# Patient Record
Sex: Female | Born: 1945
Health system: Southern US, Community
[De-identification: ages and names within clinical notes are randomized; demographics above are authoritative.]

## PROBLEM LIST (undated history)

## (undated) DIAGNOSIS — I10 Essential (primary) hypertension: Secondary | ICD-10-CM

## (undated) DIAGNOSIS — E119 Type 2 diabetes mellitus without complications: Secondary | ICD-10-CM

## (undated) DIAGNOSIS — E785 Hyperlipidemia, unspecified: Secondary | ICD-10-CM

## (undated) DIAGNOSIS — M79661 Pain in right lower leg: Secondary | ICD-10-CM

## (undated) DIAGNOSIS — I499 Cardiac arrhythmia, unspecified: Secondary | ICD-10-CM

## (undated) DIAGNOSIS — G629 Polyneuropathy, unspecified: Secondary | ICD-10-CM

## (undated) DIAGNOSIS — E559 Vitamin D deficiency, unspecified: Secondary | ICD-10-CM

## (undated) DIAGNOSIS — K219 Gastro-esophageal reflux disease without esophagitis: Secondary | ICD-10-CM

## (undated) DIAGNOSIS — M79662 Pain in left lower leg: Secondary | ICD-10-CM

## (undated) HISTORY — DX: Cardiac arrhythmia, unspecified: I49.9

## (undated) HISTORY — PX: OTHER SURGICAL HISTORY: SHX169

## (undated) HISTORY — DX: Polyneuropathy, unspecified: G62.9

## (undated) HISTORY — DX: Gastro-esophageal reflux disease without esophagitis: K21.9

## (undated) HISTORY — PX: COLONOSCOPY: SHX174

## (undated) HISTORY — PX: ABDOMINAL HYSTERECTOMY: SHX81

## (undated) HISTORY — DX: Pain in right lower leg: M79.661

## (undated) HISTORY — DX: Vitamin D deficiency, unspecified: E55.9

## (undated) HISTORY — DX: Hyperlipidemia, unspecified: E78.5

## (undated) HISTORY — DX: Pain in left lower leg: M79.662

---

## 2006-07-26 ENCOUNTER — Ambulatory Visit (HOSPITAL_COMMUNITY): Admission: RE | Admit: 2006-07-26 | Discharge: 2006-07-26 | Payer: Self-pay | Admitting: *Deleted

## 2006-08-05 ENCOUNTER — Emergency Department (HOSPITAL_COMMUNITY): Admission: EM | Admit: 2006-08-05 | Discharge: 2006-08-05 | Payer: Self-pay | Admitting: Emergency Medicine

## 2006-08-10 ENCOUNTER — Encounter (HOSPITAL_BASED_OUTPATIENT_CLINIC_OR_DEPARTMENT_OTHER): Admission: RE | Admit: 2006-08-10 | Discharge: 2006-11-08 | Payer: Self-pay | Admitting: Surgery

## 2006-11-25 ENCOUNTER — Encounter (HOSPITAL_BASED_OUTPATIENT_CLINIC_OR_DEPARTMENT_OTHER): Admission: RE | Admit: 2006-11-25 | Discharge: 2006-12-01 | Payer: Self-pay | Admitting: Surgery

## 2009-05-18 ENCOUNTER — Emergency Department (HOSPITAL_COMMUNITY): Admission: EM | Admit: 2009-05-18 | Discharge: 2009-05-18 | Payer: Self-pay | Admitting: Emergency Medicine

## 2010-09-30 NOTE — Assessment & Plan Note (Signed)
[facility]   NAME:  Sabrina Suarez, Sabrina Suarez                ACCOUNT NO.:  [id number]   MEDICAL RECORD NO.:  [id number]      DATE OF BIRTH:  01-16-1946   PHYSICIAN:  Theresia Majors. Tanda Rockers, M.D.      VISIT DATE:                                   OFFICE VISIT   SUBJECTIVE:  Sabrina Suarez is a 65 year old lady who we have followed for  leuko-clastic small vessel vasculitis.  She has been concurrently seen  by Dr. Phylliss Bob from rheumatology and Dr. Mirna Mires, her primary care  physician.  She has recently been tapered from steroids.  She returns  for follow-up.  There has been no interim drainage.  There are minor  areas of skin breakage.  There has been no fever.   OBJECTIVE:  VITAL SIGNS:  Blood pressure is 134/82, respirations 20,  pulse rate 86, temperature 98.1.  Inspection of the lower extremity shows that all of the wounds have  completely re-epithelialized.  There is no drainage.  There is  persistent 1 to 2+ edema in the lower extremities with hyperpigmentation  in the previous ulcerations.  There is no evidence of active infection,  pain or tenderness.   ASSESSMENT:  Resolve lesions of leuko-clastic small vessel vasculitis.   PLAN:  We are discharging the patient from active followup in the wound  center.  We have encouraged her to keep her follow-up appointments per  Dr. Phylliss Bob and Dr. Loleta Chance.  We have explained these recommendations to the  patient and her sister, in terms that they seem to understand.  They  express gratitude for having been seen in the clinic and indicate that  they will be compliant.  The patient is discharged.      Harold A. Tanda Rockers, M.D.  Electronically Signed     HAN/MEDQ  D:  11/29/2006  T:  11/30/2006  Job:  [id number]   cc:   Areatha Keas, M.D.  Annia Friendly. Loleta Chance, MD

## 2010-10-03 NOTE — Assessment & Plan Note (Signed)
[facility]   NAME:  Sabrina Suarez, Sabrina Suarez                ACCOUNT NO.:  [id number]   MEDICAL RECORD NO.:  [id number]           DATE OF BIRTH:   PHYSICIAN:  Theresia Majors. Tanda Rockers, M.D. VISIT DATE:  09/01/2006                                   OFFICE VISIT   VITAL SIGNS:  Blood pressure is 166/93, respirations 20, pulse rate 97,  temperature is 98.1.   PURPOSE OF TODAY'S VISIT:  [name] is a 65 year old lady who we are  seeing in followup for ulcers in blister formation related to  leukocytoclastic small vessel vasculitis.  The patient is concurrently  being seen by Dr. Phylliss Bob and Dr. Loleta Chance.  Since her last visit, she reports  that she has had less pain.  She is increasing her activity.  She has  complained of some nausea and a sensation of a medical taste in her  mouth.  She denies fever.  She denies loose stools or abdominal  cramping.   WOUND EXAM:  Inspection of the lower extremity shows that there is good  control of the previous edema with the compression wrap.  There are  lineal wrinkles in the legs.  There is no evidence of ischemia.  The  ulcers, themselves, all show evidence of improvement.  There are  multiple areas of thickened, well-adherent eschar.  There are several  areas of loosened, liquefying necrosis.  Three areas of wounds underwent  a full thickness debridement, utilizing a 15 blade.  There is no  evidence of infection or ascending cellulitis or lymphangitis.   DIAGNOSIS:  Clinical improvement of ulcerations related to  leukocytoclastic small vessel vasculitis.   MANAGEMENT PLAN & GOAL:  Patient will continue and keep all of her  appointment with her physicians.  In addition, we will see her in the  wound clinic in 1 week.  We have encouraged her to take her p.o.  analgesia prior to coming to the wound center as she will need serial  debridements to create a healthy wound bed.  We have given the patient  an opportunity to asks questions.  She seems to  understand and indicates  that she will be compliant as per above.   There is an interin note from Dr. 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  We  have requested copies of the path report.      Harold A. Tanda Rockers, M.D.  Electronically Signed     HAN/MEDQ  D:  09/01/2006  T:  09/01/2006  Job:  [id number]

## 2010-10-03 NOTE — Consult Note (Signed)
NAMEVELMER, BROADFOOT                ACCOUNT NO.:  [id number]   MEDICAL RECORD NO.:  [id number]          PATIENT TYPE:  REC   LOCATION:  FOOT                         FACILITY:  [facility]   PHYSICIAN:  Theresia Majors. Tanda Rockers, M.D.DATE OF BIRTH:  May 11, 1946   DATE OF CONSULTATION:  08/11/2006  DATE OF DISCHARGE:                                 CONSULTATION   REASON FOR CONSULTATION:  Ms. Tarnow is a 65 year old female referred by  Dr. Mirna Mires for evaluation of bilateral painful ulcerations in the  lower extremity.   IMPRESSION:  Small vessel vasculitis, questionable etiology, of the  lower extremities.   RECOMMENDATIONS:  The patient will continue on the anti-inflammatory  regimen as per Dr. Phylliss Bob.  We will see the patient biweekly for dressing  changes, serial debridements as needed and for wraps to control  secondary edema.  Our objectives will be to prevent and treat secondary  infection while she undergoes concurrent management per her  Dermatologist and Rhematologist.  Tapering of the anti-inflammatory  treatments are in progress.   SUBJECTIVE:  This 65 year old lady was in good health compensated and  stable, until 5-6 weeks ago when she noted early blister formation on  the feet.  Over the ensuing weeks the blisters became intensely painful  associated with fever, erosion, drainage and malodor.  She was seen by  her primary care physician and then secondly seen by Dr. Nita Sells in  [place] and she underwent biopsies which confirmed the presence of  vasculitis.  She was started on hydrocortisone with some initial relief  but her symptoms continued to worsen.  She was most recently seen by Dr.  Phylliss Bob who increased her steroids to 80 mg per day and added colchicine.  Thereafter, she experienced less pain.  She was seen in followup by Dr.  Loleta Chance and was referred to the wound center for dressing changes.   PAST MEDICAL HISTORY:   ALLERGIES:  HER PAST MEDICAL HISTORY IS REMARKABLE FOR  ALLERGIES TO  SUDAFED AND BENADRYL.   CURRENT MEDICATIONS:  Her current medications include Keflex 500 mg p.o.  t.i.d., OxyContin 20 mg p.r.n. for pain related to the current illness,  colchicine 0.6 mg b.i.d., Darvocet-N p.r.n., and prednisone 80 mg daily.   PAST SURGICAL HISTORY:  Past surgery has included an abdominal  hysterectomy for metromenorrhagia  approximately 20 years ago.  She  denies further surgery.   FAMILY HISTORY:  Positive for hypertension, diabetes, stroke, cancer,  and heart attack.   SOCIAL HISTORY:  She is a widow.  She has two children who live  remotely.  She is currently disabled due to degenerative lumbar spine  disease.  She is part-time employed at the [employer].   REVIEW OF SYSTEMS:  The review of systems is remarkable for the absence  of angina pectoris, her weight has been stable, there is no bowel or  bladder dysfunction, she denies visual changes and she has had some  recent hemoptysis on 2 days that she feels is related to a cut area on  her tongue.  She denies paralysis, speech impairment or  other stigmata  of TIAs.  The remainder of the review of systems is negative.   PHYSICAL EXAMINATION:  GENERAL:  She is alert, oriented, in no acute  pain.  She is accompanied by her sister.  VITAL SIGNS:  Her blood pressure is 138/76, respirations are 20, pulse  rate 74, temperature 98.2.  HEENT:  Exam is clear.  NECK:  Is supple.  Trachea is midline.  Thyroid is nonpalpable.  LUNGS:  Clear.  HEART:  The heart sounds are normal.  ABDOMEN:  Is soft.  EXTREMITIES:  The lower extremity exam is remarkable for 3+ bilateral  edema.  There are multiple macular full thickness necrotic areas with  dark eschar and minimum exudate on both lower extremities.  These wounds  were photographed from the right lateral perspective, the right medial  perspective, the left leg anterior and the left leg posteriorly.  The  wounds were categorized from these projections  and entered into the  computer program.  No debridements were performed.  The wounds all  appear to have the central area of necrosis with a halo of erythema  which is nontender.  The patient reports that 48 hours ago these wounds  were exquisitely tender.  There is no local warmth and there is no  malodor.  The dorsalis pedis pulse is 3+ bilaterally in spite of the  edema.  NEUROLOGICAL:  Her protective sensation is preserved.   DISCUSSION:  This patient presents with a history suggestive of some  form of autoimmune pathology involving small vessel vasculitis.  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  We have explained to the patient and her sister that the wound center  can be of assistance in dressing management in support of the efforts  that are currently being undertaken and prescribed by Dr. Phylliss Bob and Dr.  Margo Aye.  We have encouraged her to keep her appointments and continue all  of her medications.  We will change her dressings as need be which I  suspect will require initially twice weekly dressing changes.  We will  use a silver dressing to control MRSA colonization which she has had a  positive culture of.  We have given the patient and her sister an  opportunity to ask questions.  They seem to appreciate the approach that  has been outlined and indicate that they will be compliant.  The patient  has signed a release for Dr. Margo Aye to forward the medical records to the  wound center.  We will proceed with a silver dressing and a multilayer  compression wrap and reevaluate the patient in 3 days for possible  redressing change and debridement after she is seen by Dr. Phylliss Bob.      Harold A. Tanda Rockers, M.D.  Electronically Signed     HAN/MEDQ  D:  08/11/2006  T:  08/11/2006  Job:  [id number]  cc:   Annia Friendly. Loleta Chance, MD  Areatha Keas, M.D.

## 2010-10-03 NOTE — Assessment & Plan Note (Signed)
[facility]   NAME:  Sabrina Suarez, Sabrina Suarez                ACCOUNT NO.:  [id number]   MEDICAL RECORD NO.:  [id number]      DATE OF BIRTH:  Jan 31, 1946   PHYSICIAN:  Theresia Majors. Tanda Rockers, M.D. VISIT DATE:  09/13/2006                                   OFFICE VISIT   SUBJECTIVE:  Miss [name] is a 65 year old lady who we are seeing in  follow up for leukoclastic small vessel vasculitis with ulceration.  She  is concurrently taking 80 mg of prednisone under the direction of Dr.  Phylliss Bob, and she continues to be followed by her family practitioner, Dr.  Loleta Chance.  In the interim, she denies excessive drainage, malodor, pain, or  fever.   OBJECTIVE:  Blood pressure is 151/90, respirations 16, pulse rate is 94,  temperature is 98.3.  Inspection of the lower extremity shows that there  is bilateral 2+ edema.  Wounds #1 and 2 are free of eschar and show  granulating buds.  There is no drainage.  There is no evidence of  infection.  Wounds 3, 4, and 5 have autolytic eschars which required  full thickness debridement with hemorrhage control with direct pressure.  Pain was tolerated very well.  There is no excessive drainage or  malodorous exudate.  There is no evidence of infection.  The pedal  pulses remain readily palpable.   IMPRESSION:  Clinical improvement of LCSVV with fluid retention.   PLAN:  We will continue the serial debridements.  We will add Profore  compression wraps to help control the edema.  We will add triamcinolone  ointment for skin conditioning and to avoid blister formation attendant  with fluid retention.  The patient will be seen by Dr. Phylliss Bob tomorrow.  If need be, these dressings can be taken off and the patient can return  to the clinic for re-application of the compression wraps.  We will  otherwise see the patient in one week.  We have counseled the patient  regarding the expectations that there may be malodor and some drainage  through her wrap.  If these occur,  she can call for an interim  appointment, otherwise, this is anticipated due to the autolysis.  The  patient and her sister seem to understand and indicate that they will be  compliant.  We have given them an opportunity to ask questions.  They  express gratitude for having been seen in the clinic and indicate that  they will be compliant.      Harold A. Tanda Rockers, M.D.  Electronically Signed     HAN/MEDQ  D:  09/13/2006  T:  09/13/2006  Job:  [id number]   cc:   Areatha Keas, M.D.  Dr. Loleta Chance

## 2010-10-03 NOTE — Consult Note (Signed)
Sabrina Suarez, Sabrina Suarez                ACCOUNT NO.:  [id number]   MEDICAL RECORD NO.:  [id number]          PATIENT TYPE:  REC   LOCATION:  FOOT                         FACILITY:  [facility]   PHYSICIAN:  Jonelle Sports. Sevier, M.D. DATE OF BIRTH:  1945/06/23   DATE OF CONSULTATION:  08/18/2006  DATE OF DISCHARGE:                                 CONSULTATION   WOUND CENTER PROGRESS NOTE:   HISTORY:  This 65 year old black female is seen with multiple necrotic  ulcerative lesions of the lower extremities which on biopsy have proved  to be a type of vasculitis.  She has been placed on prednisone by Dr.  Phylliss Bob and has made some improvement since that time and most happily  reports that there have been no new lesions developed since her last  visit here a week ago.  Our job here has been primarily to deal with the  wound care itself and particularly dressing changes as needed.   She reports essentially continuous burning pain rather severe in both  lower extremities at all times she would rank on a level of 7-8 out of  10.  She is on Vicodin for this which is being supplied elsewhere.  She  admits that gives her some reasonable control.   She has been otherwise well and has had no other change in her  medications.   EXAMINATION:  Today blood pressure 134/80, pulse 71 regular,  respirations 20, temperature 98.3.   There are on both legs numerous ulcerated areas with necrotic central  eschars, if not necrotic total eschars, on most of these.  The others  are punched out and clearly are full thickness ulcerations.  The major  ones of these are cataloged with measurements in the patient's wound  center chart being cataloged primarily in groupings as the right lateral  leg, the right medial leg, the left anterior leg, the left posterior leg  and the left anterior ankle.   Debridement is attempted today and I am actually able fully to remove  the eschars on two of the right lateral lesions and the one  left lateral  lesion.  In some of the others I am able partially to remove eschars but  they are simply too deep that attempting to dislodge them is quite  painful to the patient.   What I have done is to crosshatch all of the remaining eschars and  following this all wounds are treated with Vaseline to the periwound  area for its protection and then an application of Accuzyme to the  wounds themselves both those still carrying eschars and to the others  which are not carrying eschars but which are nonetheless inadequately  debrided.  The areas are then covered with SofSorb pads and both lower  extremities are placed in Profore wraps.   IMPRESSION:  My impression is that there has been improvement primarily  in that there have been no new lesions to develop and maybe some very,  very limited improvement in the ones that already exist.   It is anticipated that we will continue wound care and that  as the  inflammatory process hopefully subsides under the influence of the  steroid therapy we can get these eschars totally cleared and then  proceed with Oasis or possibly even Apligraf in some of the larger  lesions if such seems indicated.  It is certainly anticipated this will  be a long-term process and the patient is advised accordingly.   She is to continue her visits to her medical doctor, Dr. Loleta Chance and also  to the dermatologist, Dr. Nita Sells as well as to return here in 1 week.           ______________________________  Jonelle Sports. Cheryll Cockayne, M.D.     RES/MEDQ  D:  08/18/2006  T:  08/18/2006  Job:  [id number]   cc:   Mertha Finders., M.D.  Fax: [phone]   Areatha Keas, M.D.  Fax: [phone]

## 2010-10-03 NOTE — Assessment & Plan Note (Signed)
[facility]   NAME:  Sabrina Suarez, Sabrina Suarez                ACCOUNT NO.:  [id number]   MEDICAL RECORD NO.:  [id number]      DATE OF BIRTH:  [birth year]   PHYSICIAN:  Theresia Majors. Tanda Rockers, M.D. VISIT DATE:  10/18/2006                                   OFFICE VISIT   SUBJECTIVE:  Sabrina Suarez is a 65 year old lady who we are following for  leukocytoclastic small vessel vasculitis with bilateral ulcerations.  The patient has concurrently been on 40 mg of prednisone.  She is being  tapered down to 20 mg to begin tomorrow.  In the interim, she has had no  excessive drainage, malodor, or pain.  She has been using an antiseptic  soap washes twice a day and reports that there has been significant  improvement in the appearance of the wounds.  She continues to have  moderate swelling.  There has been no fever.   OBJECTIVE:  Blood pressure is 142/88, respirations 16, pulse rate 92,  temperature 98.4.  She is accompanied by her sister.  Wounds 1, 2, 3, 4,  and 5 have all been measured and photographed.  There are areas of  improvement demonstrated at all levels with advancing epithelium.  Areas  of crusted exudate were left undisturbed, as they are significantly  decreased and are improving with the mechanical debridements attendant  with showers and baths.  The pedal pulses remain palpable.  There is no  evidence of vascular compromise.   ASSESSMENT:  Clinical improvement.   PLAN:  We will continue local hygiene using the antiseptic soap, b.i.d.  showers, and tub baths.  We will reevaluate the patient in 1 week.  Judging from the presence of today's edema, which is 2+, the patient may  require compression wraps to continue contraction of the wounds.  We  have explained this to Sabrina Suarez in terms that she seems to understand.  We have given her an opportunity to ask questions with regard to  treatment plan.  She indicates that she understands and will be  compliant with the  aforementioned.  We will reevaluate the patient in 1  week.      Harold A. Tanda Rockers, M.D.  Electronically Signed     HAN/MEDQ  D:  10/18/2006  T:  10/18/2006  Job:  [id number]

## 2010-10-03 NOTE — Assessment & Plan Note (Signed)
[facility]   NAME:  AMORY, ZBIKOWSKI                ACCOUNT NO.:  [id number]   MEDICAL RECORD NO.:  [id number]           DATE OF BIRTH:   PHYSICIAN:  Maxwell Caul, M.D.      VISIT DATE:                                   OFFICE VISIT   SUBJECTIVE:  Ms. Woodside is a lady with a multitude of lower extremity  ulcers secondary to leukocytoclastic vasculitis.  She has been on  prednisone, currently 60 mg per day.  Over the last week, she has simply  been washing these b.i.d. with antiseptic soap and apply dry dressings.  She returns today in followup.   OBJECTIVE:  Temperature is 98.3, pulse 76, respiration is 18, blood  pressure 140/90.  Most of the worrisome wounds actually are on her right  leg on the lateral aspect.  I went ahead and debrided 2 of these wounds  using EMLA on 1 and LET on the other for anesthesia, hemostasis with  direct pressure, using a #15 blade.  There were 2 other wounds on the  medial aspect of the right leg that probably needed debridement, they  have thick black eschar on them; however, due to pain issues, I did not  proceed with this.  Her left leg actually looks a lot better than the  right with no edema and wounds that look like the eschar is  progressively coming off on its own.   PLAN:  We applied Promogran to the open wounds in the right leg and  wrapped this in a Profore.  There was no evidence of infection in any of  these wounds.  I think she will need further debridements at a later  date; however, most of the open wounds now appear clean, and we will  have to see if control of the edema with the prednisone will allow some  healing to occur in this difficult clinical state.           ______________________________  Maxwell Caul, M.D.     MGR/MEDQ  D:  09/27/2006  T:  09/27/2006  Job:  [id number]

## 2010-10-03 NOTE — Assessment & Plan Note (Signed)
[facility]   NAME:  Sabrina Suarez, Sabrina Suarez                ACCOUNT NO.:  [id number]   MEDICAL RECORD NO.:  [id number]      DATE OF BIRTH:  [birth year]   PHYSICIAN:  Theresia Majors. Tanda Rockers, M.D. VISIT DATE:  11/01/2006                                   OFFICE VISIT   SUBJECTIVE:  Sabrina Suarez is a 65 year old lady who is being treated  collectively by Dr. Phylliss Bob in the wound center for the management of  leukocytoclastic small vessel vasculitis and ulcerations.  Since her  last visit, she has continued to decrease her prednisone.  She is now on  30 mg a day, and she has been started on methotrexate.  The wounds have  shown continued improvement.  There still remains some drainage, but  there has been no fever.  She completed a 10-day course of Septra and  doxycycline.  She has had no fever and no pain.  She continues to be  ambulatory.   OBJECTIVE:  Blood pressure is 153/104, respirations 20, pulse rate 76,  temperature is 98.3.  Inspection of the lower extremity shows that the  ulcers have uniformly improved.  The wounds have been previously  cataloged and numbered 1 through 5.  Wound #3 is completely resolved.  Wounds 1, 2, 4 and 5 all show decrease in volume.  There are areas of  desquamation, but there is no evidence of extensive cellulitis or  abscess formation.  We have recultured the wounds today. Most recent  culture was positive for methicillin resistant staph.  Pedal pulses  remain palpable.   ASSESSMENT:  Clinical improvement.   PLAN:  We will continue daily antiseptic soap washes.  We will continue  doxycycline, and add 100 mg p.o. b.i.d. and Septra-DS 1 p.o. b.i.d.  We  will reevaluate the patient in one month p.r.n.      Harold A. Tanda Rockers, M.D.  Electronically Signed     HAN/MEDQ  D:  11/01/2006  T:  11/01/2006  Job:  [id number]   cc:   Areatha Keas, M.D.

## 2010-10-03 NOTE — Assessment & Plan Note (Signed)
[facility]   NAME:  Sabrina Suarez                ACCOUNT NO.:  [id number]   MEDICAL RECORD NO.:  [id number]      DATE OF BIRTH:  12/13/1945   PHYSICIAN:  Theresia Majors. Tanda Rockers, M.D. VISIT DATE:  08/25/2006                                   OFFICE VISIT   SUBJECTIVE:  Ms. Sabrina Suarez is a 65 year old lady who we are following for a  leukoclastic small vessel vasculitis with ulceration in her lower  extremities.  She has been seen in concert with Dermatology and  Rheumatology.  She is currently on a stable dose of steroids.  She is  taking 80 mg of prednisone a day.  She reports that she has polyphagia,  but her pain is significantly less.  There has been no malodorous  drainage and no fever.   OBJECTIVE:  Blood pressure is 140/70, respirations 20, pulse rate 89,  temperature 98.6.   Inspection of the lower extremities shows that the areas of full  thickness necrosis are persistent.  There is associated 2+ edema.  There  is no evidence of ascending infection or cellulitis.  The wounds on the  lateral side of the right leg, the medial side of the right leg, and the  anteromedial left leg were inspected, photographed, and catalogued.  The  wounds were debrided using topical EMLA cream.  There was a moderate  amount of pain, but the patient tolerated the procedures very well.  We  put a test dose of Accuzyme on several wounds and the patient reported  no increased pain.   ASSESSMENT:  Leukoclastic small vessel vasculitis with ulceration,  improved.   PLAN:  We have placed the patient in bilateral Profore wraps with  topical Accuzyme enzymatic debridement.  We will reevaluate her in one  week.      Harold A. Tanda Rockers, M.D.  Electronically Signed     HAN/MEDQ  D:  08/25/2006  T:  08/25/2006  Job:  [id number]   cc:   Areatha Keas, M.D.  Dr. Margo Aye  Dr. Loleta Chance

## 2010-10-03 NOTE — Assessment & Plan Note (Signed)
[facility]   NAME:  Sabrina Suarez, Sabrina Suarez                ACCOUNT NO.:  [id number]   MEDICAL RECORD NO.:  [id number]      DATE OF BIRTH:  Jan 10, 1946   PHYSICIAN:  Theresia Majors. Tanda Rockers, M.D. VISIT DATE:  09/20/2006                                   OFFICE VISIT   SUBJECTIVE:  Sabrina Suarez is a 65 year old lady who we are following for a  leukoclastic small vessel vasculitis.  In the interim, she has been seen  by Dr. Phylliss Bob who has decreased her prednisone from 80 mg to 60 mg.  She  was placed in Profore wraps during her last visit.  She continued  without excessive drainage, malodor, pain, or fever.  She does report  some discomfort on the lateral aspect of the right lower extremity.   OBJECTIVE:  Blood pressure is 147/68, respirations are 20, pulse rate  89, temperature is 98.4.  Inspection of the lower extremities shows that  all of the wounds are decreasing in volume.  They are no longer  edematous.  The eschars are liquefying with areas of liquefaction  necrosis and a yellowish-brown exudate.  Wounds #3, 4, and 5 on the left  lower extremity were debrided with a #10 blade with hemorrhage  controlled with direct pressure.  The patient tolerated this debridement  very well with minimum pain.  There is no evidence of vascular  compromise.  There is no evidence of ascending infection.   ASSESSMENT:  Clinical improvement.   PLAN:  We will not place the patient in Profore wraps, but rather apply  fish-nets and 4 x 4's.  Allow her to take antiseptic soap baths twice a  day, and re-dress the wounds with the dry dressings.  We will reevaluate  her in 1 week.  We have counseled the patient that if she develops  progressive swelling, she should give [place] a call and plan to come back to  the clinic to have the compression wraps replaced.  At this point, her  edema is reasonably controlled with elevation.  The discomfort that she  is experiencing in the lateral left lower extremity is  not explained by  the physical exam.  There appears to be no evidence of infection.  We  have suggested that she increase her OxyContin to every 4 to 6 hours as  needed to control the discomfort.  If she is still experiencing pain,  she should give the wound center or Dr. Phylliss Bob an interim call.  Otherwise, we will reevaluate her in 1 week.  We have given the patient  and her sister an opportunity to ask questions.  They seem to understand  and indicate that they will be compliant.      Harold A. Tanda Rockers, M.D.  Electronically Signed     HAN/MEDQ  D:  09/20/2006  T:  09/20/2006  Job:  [id number]   cc:   Areatha Keas, M.D.  Annia Friendly. Loleta Chance, MD

## 2010-10-03 NOTE — Assessment & Plan Note (Signed)
[facility]   NAME:  Sabrina Suarez, Sabrina Suarez                ACCOUNT NO.:  [id number]   MEDICAL RECORD NO.:  [id number]      DATE OF BIRTH:  [birth year]   PHYSICIAN:  Theresia Majors. Tanda Rockers, M.D. VISIT DATE:  10/04/2006                                   OFFICE VISIT   SUBJECTIVE:  Sabrina Suarez is a 65 year old lady who we are treating for  the ulcerations related to leukoclastic small vessel vasculitis.  In the  interim, we have treated her with mild compression and antiseptic soap  bathing and serial debridements.  She is currently being seen by Dr.  Phylliss Bob and has started a tapering of her steroid therapy.  She denies  recurrent ulcerations, excessive pain, or malodor.   OBJECTIVE:  Blood pressure is 149/94, respirations 20, pulse rate 91,  temperature 98.5.  Inspection of the lower extremity shows that the  wounds are in varying stages of healing, which range from 100%  granulation to heavy mucopurulent scabs.  Multiple areas of scabs were  debrided using a sharp technique.  We debrided 3 separate areas.  Hemorrhage was controlled with direct pressure.  The wounds were not  excessively tender.  The patient tolerated this procedure very well.  There is 1+ to trace edema.  No evidence of active infection and no  evidence of ischemia.   ASSESSMENT:  Clinically responding ulcers of leukoclastic small vessel  vasculitis.   PLAN:  We have changed the patient's wound care technique now to include  twice daily baths utilizing antiseptic soap and dry dressings.  We will  re-evaluate the patient in 2 weeks.  We have counseled the patient and  her sister that if there is any evidence of recurrent wounds, excessive  pain, malodor, they are to call the office for an interim appointment.  They both seem to understand and indicate that they will be compliant.      Harold A. Tanda Rockers, M.D.  Electronically Signed     HAN/MEDQ  D:  10/04/2006  T:  10/04/2006  Job:  [id number]

## 2010-10-03 NOTE — Assessment & Plan Note (Signed)
[facility]   NAME:  Sabrina Suarez, Sabrina Suarez                ACCOUNT NO.:  [id number]   MEDICAL RECORD NO.:  [id number]      DATE OF BIRTH:  1945/09/25   PHYSICIAN:  Theresia Majors. Tanda Rockers, M.D.      VISIT DATE:                                   OFFICE VISIT   SUBJECTIVE:  Sabrina Suarez is a 65 year old lady who we are following for  leukoclastic small vessel vasculitis with ulcerations and bulla  formation.  In the interim, we have treated her with topical Accuzyme  and Profore Lite.  She continues on 80 mg of prednisone daily.  She has  had no interim fever or excessive pain.  She returns for follow up.   OBJECTIVE:  Blood pressure is 155/97.  Respirations 18.  Pulse rate 90.  Temperature 98.4.  inspection of the lower extremity shows that the  right lateral leg has areas of full thickness necrosis breakdown and  liquefaction on the _______ block.  These areas were debrided with a 10  blade with hemorrhage control with direct pressure.  Similarly there was  a debridement of the right medial leg.  Using a full thickness and 10  blade with hemorrhage control with direct pressure.  The ulcers of the  anterior, posterior and lateral left lower extremity have well adherent  scars without liquefaction.  There is no evidence of active infection or  vascular compromise.   ASSESSMENT:  Clinical improvement.   PLAN:  We will place 4 x 4 dressings over these open wounds and apply a  fishnet to keep the dressings in place.  We have given the patient  liberty to take a tub bath with antiseptic soap and to reapply the  dressings as demonstrated in the wound center.  We will reevaluate her  in 4 days.  Patient is to continue all of her follow ups with her  primary care physicians.      Harold A. Tanda Rockers, M.D.  Electronically Signed     HAN/MEDQ  D:  09/08/2006  T:  09/08/2006  Job:  [id number]

## 2014-07-30 DIAGNOSIS — I10 Essential (primary) hypertension: Secondary | ICD-10-CM | POA: Diagnosis not present

## 2014-07-30 DIAGNOSIS — R739 Hyperglycemia, unspecified: Secondary | ICD-10-CM | POA: Diagnosis not present

## 2014-07-30 DIAGNOSIS — E1165 Type 2 diabetes mellitus with hyperglycemia: Secondary | ICD-10-CM | POA: Diagnosis not present

## 2014-09-26 DIAGNOSIS — E119 Type 2 diabetes mellitus without complications: Secondary | ICD-10-CM | POA: Diagnosis not present

## 2014-10-22 DIAGNOSIS — R928 Other abnormal and inconclusive findings on diagnostic imaging of breast: Secondary | ICD-10-CM | POA: Diagnosis not present

## 2014-10-22 DIAGNOSIS — Z1231 Encounter for screening mammogram for malignant neoplasm of breast: Secondary | ICD-10-CM | POA: Diagnosis not present

## 2014-11-07 DIAGNOSIS — N6489 Other specified disorders of breast: Secondary | ICD-10-CM | POA: Diagnosis not present

## 2014-11-07 DIAGNOSIS — R928 Other abnormal and inconclusive findings on diagnostic imaging of breast: Secondary | ICD-10-CM | POA: Diagnosis not present

## 2014-11-16 DIAGNOSIS — E780 Pure hypercholesterolemia: Secondary | ICD-10-CM | POA: Diagnosis not present

## 2014-11-16 DIAGNOSIS — I10 Essential (primary) hypertension: Secondary | ICD-10-CM | POA: Diagnosis not present

## 2014-11-16 DIAGNOSIS — E784 Other hyperlipidemia: Secondary | ICD-10-CM | POA: Diagnosis not present

## 2014-11-16 DIAGNOSIS — E1165 Type 2 diabetes mellitus with hyperglycemia: Secondary | ICD-10-CM | POA: Diagnosis not present

## 2014-11-16 DIAGNOSIS — E785 Hyperlipidemia, unspecified: Secondary | ICD-10-CM | POA: Diagnosis not present

## 2014-11-16 DIAGNOSIS — E119 Type 2 diabetes mellitus without complications: Secondary | ICD-10-CM | POA: Diagnosis not present

## 2014-11-16 DIAGNOSIS — K219 Gastro-esophageal reflux disease without esophagitis: Secondary | ICD-10-CM | POA: Diagnosis not present

## 2014-11-27 DIAGNOSIS — E1121 Type 2 diabetes mellitus with diabetic nephropathy: Secondary | ICD-10-CM | POA: Diagnosis not present

## 2014-11-27 DIAGNOSIS — E1165 Type 2 diabetes mellitus with hyperglycemia: Secondary | ICD-10-CM | POA: Diagnosis not present

## 2014-11-27 DIAGNOSIS — I10 Essential (primary) hypertension: Secondary | ICD-10-CM | POA: Diagnosis not present

## 2014-11-27 DIAGNOSIS — K219 Gastro-esophageal reflux disease without esophagitis: Secondary | ICD-10-CM | POA: Diagnosis not present

## 2015-02-26 DIAGNOSIS — E1121 Type 2 diabetes mellitus with diabetic nephropathy: Secondary | ICD-10-CM | POA: Diagnosis not present

## 2015-02-26 DIAGNOSIS — K219 Gastro-esophageal reflux disease without esophagitis: Secondary | ICD-10-CM | POA: Diagnosis not present

## 2015-02-26 DIAGNOSIS — Z23 Encounter for immunization: Secondary | ICD-10-CM | POA: Diagnosis not present

## 2015-02-26 DIAGNOSIS — E784 Other hyperlipidemia: Secondary | ICD-10-CM | POA: Diagnosis not present

## 2015-02-26 DIAGNOSIS — I10 Essential (primary) hypertension: Secondary | ICD-10-CM | POA: Diagnosis not present

## 2015-06-04 ENCOUNTER — Other Ambulatory Visit (HOSPITAL_COMMUNITY): Payer: Self-pay | Admitting: Internal Medicine

## 2015-06-04 DIAGNOSIS — Z23 Encounter for immunization: Secondary | ICD-10-CM | POA: Diagnosis not present

## 2015-06-04 DIAGNOSIS — E119 Type 2 diabetes mellitus without complications: Secondary | ICD-10-CM | POA: Diagnosis not present

## 2015-06-04 DIAGNOSIS — E785 Hyperlipidemia, unspecified: Secondary | ICD-10-CM | POA: Diagnosis not present

## 2015-06-04 DIAGNOSIS — I10 Essential (primary) hypertension: Secondary | ICD-10-CM | POA: Diagnosis not present

## 2015-06-04 DIAGNOSIS — E1165 Type 2 diabetes mellitus with hyperglycemia: Secondary | ICD-10-CM | POA: Diagnosis not present

## 2015-06-04 DIAGNOSIS — M1990 Unspecified osteoarthritis, unspecified site: Secondary | ICD-10-CM

## 2015-06-04 DIAGNOSIS — K219 Gastro-esophageal reflux disease without esophagitis: Secondary | ICD-10-CM | POA: Diagnosis not present

## 2015-06-04 DIAGNOSIS — E784 Other hyperlipidemia: Secondary | ICD-10-CM | POA: Diagnosis not present

## 2015-06-04 DIAGNOSIS — Z780 Asymptomatic menopausal state: Secondary | ICD-10-CM

## 2015-06-12 ENCOUNTER — Ambulatory Visit (HOSPITAL_COMMUNITY)
Admission: RE | Admit: 2015-06-12 | Discharge: 2015-06-12 | Disposition: A | Discharge status: Home / Self Care | Payer: Medicare Other | Source: Ambulatory Visit | Attending: Internal Medicine | Admitting: Internal Medicine

## 2015-06-12 DIAGNOSIS — Z780 Asymptomatic menopausal state: Secondary | ICD-10-CM | POA: Diagnosis not present

## 2015-06-12 DIAGNOSIS — I10 Essential (primary) hypertension: Secondary | ICD-10-CM | POA: Diagnosis not present

## 2015-06-12 DIAGNOSIS — M1990 Unspecified osteoarthritis, unspecified site: Secondary | ICD-10-CM

## 2015-06-25 DIAGNOSIS — N390 Urinary tract infection, site not specified: Secondary | ICD-10-CM | POA: Diagnosis not present

## 2015-06-25 DIAGNOSIS — E119 Type 2 diabetes mellitus without complications: Secondary | ICD-10-CM | POA: Diagnosis not present

## 2015-06-25 DIAGNOSIS — I10 Essential (primary) hypertension: Secondary | ICD-10-CM | POA: Diagnosis not present

## 2015-07-04 ENCOUNTER — Encounter (HOSPITAL_COMMUNITY): Payer: Self-pay | Admitting: Emergency Medicine

## 2015-07-04 ENCOUNTER — Inpatient Hospital Stay (HOSPITAL_COMMUNITY)
Admission: EM | Admit: 2015-07-04 | Discharge: 2015-07-07 | DRG: 641 | Disposition: A | Discharge status: Home / Self Care | Payer: Medicare Other | Attending: Internal Medicine | Admitting: Internal Medicine

## 2015-07-04 DIAGNOSIS — I10 Essential (primary) hypertension: Secondary | ICD-10-CM | POA: Diagnosis not present

## 2015-07-04 DIAGNOSIS — R531 Weakness: Secondary | ICD-10-CM | POA: Diagnosis not present

## 2015-07-04 DIAGNOSIS — E119 Type 2 diabetes mellitus without complications: Secondary | ICD-10-CM

## 2015-07-04 DIAGNOSIS — E876 Hypokalemia: Secondary | ICD-10-CM | POA: Diagnosis present

## 2015-07-04 DIAGNOSIS — R001 Bradycardia, unspecified: Secondary | ICD-10-CM | POA: Diagnosis not present

## 2015-07-04 DIAGNOSIS — R202 Paresthesia of skin: Secondary | ICD-10-CM | POA: Diagnosis present

## 2015-07-04 DIAGNOSIS — R208 Other disturbances of skin sensation: Secondary | ICD-10-CM | POA: Diagnosis not present

## 2015-07-04 DIAGNOSIS — R200 Anesthesia of skin: Secondary | ICD-10-CM | POA: Diagnosis not present

## 2015-07-04 HISTORY — DX: Type 2 diabetes mellitus without complications: E11.9

## 2015-07-04 HISTORY — DX: Essential (primary) hypertension: I10

## 2015-07-04 NOTE — ED Notes (Signed)
Having numbness for 2 weeks.  Both, chest and back of neck.  Pt says numbness draws up fingers.  Denies pain.

## 2015-07-04 NOTE — ED Notes (Signed)
While doing vitals, tech noted low heart rate.  Informed RN.  Pt placed on cardiac monitor and EKG performed

## 2015-07-04 NOTE — ED Notes (Signed)
Numbness and tingling in arms, chest, and back for 3-4 weeks per pt.  Pt states she told Dr. Rene Paci about problem 3-4 weeks ago and had some blood work and imaging but never got results.  Has been treated for diabetic neuropathy in legs in past

## 2015-07-04 NOTE — ED Notes (Signed)
EKG given to [facility]

## 2015-07-04 NOTE — ED Provider Notes (Signed)
CSN: [id number]     Arrival date & time 07/04/15  1842 History  By signing my name below, I, [name], attest that this documentation has been prepared under the direction and in the presence of Rolland Porter, MD at [phone]. Electronically Signed: [name], ED Scribe. 07/04/2015. 2:27 AM.  Chief Complaint  Patient presents with  . Numbness    from 2 weeks.   The history is provided by the patient. No language interpreter was used.   HPI Comments: Sabrina Suarez is a 70 y.o. female, with a h/o DM and HTN, who presents to the Emergency Department complaining of numbness in bilateral arms extending to her elbow and in bilateral hands, that has been intermittent for ~ 2 weeks, and lasts for 1 day at a time, being constant throughout the day. She notes the numbness will be present in bilateral arms simultaneously at onset. The last episode of hand numbness occurred earlier today while in ED waiting room but she denies any currently. Also denies numbness episodes in her feet. Additionally she reports the same numb feeling to be present in her chest and posterior neck that lasts for ~ 10 minutes at a time, X 2 weeks. She denies a pattern to this chest numbness. The pt has been ambulatory and able to go about her daily activities without onset of chest numbness. She denies associated diaphoresis, SOB, nausea, vomiting, BLE edema, headaches, dysphasia, or aphasia. No recent medication changes. She notes she visited her PCP office last week and was diagnosed with a UTI, and she finished the course of antibiotics 3 days ago. She denies any urinary symptoms. CBG over the past 6 months has been hovering around 100, per patient. She does not check her BP at home but notes hypertension on PCP visits for a long time, but her BP medications have not been changed.    PCP: Dr. Legrand Rams.   Past Medical History  Diagnosis Date  . Diabetes mellitus without complication ([place])   . Hypertension    Past Surgical  History  Procedure Laterality Date  . Abdominal hysterectomy     History reviewed. No pertinent family history. Social History  Substance Use Topics  . Smoking status: Never Smoker   . Smokeless tobacco: None  . Alcohol Use: No   Lives alone  OB History    No data available     Review of Systems  Constitutional: Negative for fever and diaphoresis.  Respiratory: Negative for shortness of breath.   Cardiovascular: Negative for leg swelling.  Gastrointestinal: Negative for nausea, vomiting and diarrhea.  Allergic/Immunologic: Positive for immunocompromised state ( [place]).  Neurological: Positive for numbness. Negative for headaches.  All other systems reviewed and are negative.   Allergies  Benadryl and Sudafed  Home Medications   Prior to Admission medications   Medication Sig Start Date End Date Taking? Authorizing Provider  ciprofloxacin (CIPRO) 250 MG tablet Take 250 mg by mouth daily. 06/25/15  Yes Historical Provider, MD  esomeprazole (NEXIUM) 40 MG capsule Take 40 mg by mouth daily at 12 noon.   Yes Historical Provider, MD  gabapentin (NEURONTIN) 100 MG capsule Take 100 mg by mouth at bedtime.   Yes Historical Provider, MD  hydrALAZINE (APRESOLINE) 50 MG tablet Take 50 mg by mouth 2 (two) times daily.   Yes Historical Provider, MD  losartan-hydrochlorothiazide (HYZAAR) 100-25 MG tablet Take 1 tablet by mouth daily.   Yes Historical Provider, MD  metFORMIN (GLUCOPHAGE) 500 MG tablet Take 500 mg by  mouth 2 (two) times daily with a meal.   Yes Historical Provider, MD  potassium chloride (K-DUR,KLOR-CON) 10 MEQ tablet Take 10 mEq by mouth 2 (two) times daily.   Yes Historical Provider, MD  rosuvastatin (CRESTOR) 20 MG tablet Take 20 mg by mouth daily.   Yes Historical Provider, MD   BP 204/135 mmHg  Pulse 66  Temp(Src) 98.5 F (36.9 C) (Oral)  Resp 14  Ht 5\' 4"  (1.626 m)  Wt 165 lb (74.844 kg)  BMI 28.31 kg/m2  SpO2 98%  Vital signs normal except for  hypertension  Physical Exam  Constitutional: She is oriented to person, place, and time. She appears well-developed and well-nourished.  Non-toxic appearance. She does not appear ill. No distress.  HENT:  Head: Normocephalic and atraumatic.  Right Ear: External ear normal.  Left Ear: External ear normal.  Nose: Nose normal. No mucosal edema or rhinorrhea.  Mouth/Throat: Oropharynx is clear and moist and mucous membranes are normal. No dental abscesses or uvula swelling.  Eyes: Conjunctivae and EOM are normal. Pupils are equal, round, and reactive to light.  Neck: Normal range of motion and full passive range of motion without pain. Neck supple.  Cardiovascular: Normal rate, regular rhythm and normal heart sounds.  Exam reveals no gallop and no friction rub.   No murmur heard. Pulmonary/Chest: Effort normal and breath sounds normal. No respiratory distress. She has no wheezes. She has no rhonchi. She has no rales. She exhibits no tenderness and no crepitus.  Abdominal: Soft. Normal appearance and bowel sounds are normal. She exhibits no distension. There is no tenderness. There is no rebound and no guarding.  Musculoskeletal: Normal range of motion. She exhibits no edema or tenderness.  Moves all extremities well. When the patient's blood pressure cuff went up she had some carpal pedal spasm of her hand.  Neurological: She is alert and oriented to person, place, and time. She has normal strength. No cranial nerve deficit.  No pronator drift; equal grips; heel to shin test coordinated bilaterally; finger to nose test coordinated bilaterally; no lower motor weakness.   Skin: Skin is warm, dry and intact. No rash noted. No erythema. No pallor.  Psychiatric: She has a normal mood and affect. Her speech is normal and behavior is normal. Her mood appears not anxious.  Nursing note and vitals reviewed.   ED Course  Procedures   Medications  magnesium sulfate IVPB 2 g 50 mL (not administered)   calcium gluconate 1 g in sodium chloride 0.9 % 100 mL IVPB (not administered)  potassium chloride SA (K-DUR,KLOR-CON) CR tablet 40 mEq (not administered)  cloNIDine (CATAPRES) tablet 0.1 mg (0.1 mg Oral Given 07/05/15 0042)    DIAGNOSTIC STUDIES: Oxygen Saturation is 98% on RA, normal by my interpretation.    COORDINATION OF CARE: 12:34 AM Discussed treatment plan which includes to order chest Xray, Xray of C-spine, head CT, and diagnostic labs with pt at bedside and pt agreed to plan. We discussed doing a CT scan of her neck to look for any type of impingement on the nerve secondary to her hands, CT scan the head was done to rule out any type intracranial problem such as MS or stroke. Also did laboratory testing to look for electrolyte abnormalities. She was given clonidine for her hypertension.  Nurses report an episode in triage where the patient's heart rate dropped into the 40s. There was a monitor strip that showed she remained in a sinus rhythm. EKG was done however by  the time they did the EKG her heart rate was back up to 79.  Patient's labs show patient has significant hypomagnesemia down to 0.5. She was started on IV magnesium. Her calcium is also noted to be low despite a normal total protein and albumin. She was given 1 g of calcium gluconate IV. She was given oral potassium for her hypokalemia.  02:20 Dr Shanon Brow, hospitalist, admit to step-down to Dr Legrand Rams service    Labs Review Results for orders placed or performed during the hospital encounter of 07/04/15  Comprehensive metabolic panel  Result Value Ref Range   Sodium 139 135 - 145 mmol/L   Potassium 2.7 (LL) 3.5 - 5.1 mmol/L   Chloride 100 (L) 101 - 111 mmol/L   CO2 28 22 - 32 mmol/L   Glucose, Bld 103 (H) 65 - 99 mg/dL   BUN 18 6 - 20 mg/dL   Creatinine, Ser 0.95 0.44 - 1.00 mg/dL   Calcium 6.3 (LL) 8.9 - 10.3 mg/dL   Total Protein 7.5 6.5 - 8.1 g/dL   Albumin 3.9 3.5 - 5.0 g/dL   AST 20 15 - 41 U/L   ALT 11 (L) 14  - 54 U/L   Alkaline Phosphatase 45 38 - 126 U/L   Total Bilirubin 0.4 0.3 - 1.2 mg/dL   GFR calc non Af Amer 60 (L) >60 mL/min   GFR calc Af Amer >60 >60 mL/min   Anion gap 11 5 - 15  CBC with Differential  Result Value Ref Range   WBC 7.6 4.0 - 10.5 K/uL   RBC 4.29 3.87 - 5.11 MIL/uL   Hemoglobin 10.0 (L) 12.0 - 15.0 g/dL   HCT 32.0 (L) 36.0 - 46.0 %   MCV 74.6 (L) 78.0 - 100.0 fL   MCH 23.3 (L) 26.0 - 34.0 pg   MCHC 31.3 30.0 - 36.0 g/dL   RDW 15.7 (H) 11.5 - 15.5 %   Platelets 261 150 - 400 K/uL   Neutrophils Relative % 57 %   Neutro Abs 4.4 1.7 - 7.7 K/uL   Lymphocytes Relative 34 %   Lymphs Abs 2.6 0.7 - 4.0 K/uL   Monocytes Relative 7 %   Monocytes Absolute 0.5 0.1 - 1.0 K/uL   Eosinophils Relative 2 %   Eosinophils Absolute 0.1 0.0 - 0.7 K/uL   Basophils Relative 0 %   Basophils Absolute 0.0 0.0 - 0.1 K/uL  Magnesium  Result Value Ref Range   Magnesium 0.5 (LL) 1.7 - 2.4 mg/dL  Troponin I  Result Value Ref Range   Troponin I <0.03 <0.031 ng/mL   Laboratory interpretation all normal except stable anemia, hypokalemia, hypocalcemia, hypomagnesemia     Imaging Review Dg Chest 2 View  07/05/2015  CLINICAL DATA:  Upper extremity numbness and generalized weakness for 2 weeks. History of hypertension, diabetes. EXAM: CHEST  2 VIEW COMPARISON:  None available for comparison at time of study interpretation. FINDINGS: The cardiac silhouette is mildly enlarged. Tortuous aorta can be associated with hypertension. No pleural effusion or focal consolidation. No pneumothorax. Soft tissue planes and included osseous structure nonsuspicious. IMPRESSION: Mild cardiomegaly, no acute pulmonary process. Electronically Signed   By: Elon Alas M.D.   On: 07/05/2015 01:17   Ct Head Wo Contrast  Ct Cervical Spine Wo Contrast  07/05/2015  CLINICAL DATA:  Upper extremity numbness and generalized weakness for 2 weeks. History of hypertension, diabetes. EXAM: CT HEAD WITHOUT CONTRAST CT  CERVICAL SPINE WITHOUT CONTRAST TECHNIQUE: Multidetector CT imaging of  the head and cervical spine was performed following the standard protocol without intravenous contrast. Multiplanar CT image reconstructions of the cervical spine were also generated. COMPARISON:  None. FINDINGS: CT HEAD FINDINGS The ventricles and sulci are normal for age. No intraparenchymal hemorrhage, mass effect nor midline shift. No acute large vascular territory infarcts. No abnormal extra-axial fluid collections. Basal cisterns are patent. Mild calcific atherosclerosis of the carotid siphons. No skull fracture. The included ocular globes and orbital contents are non-suspicious. The mastoid aircells and included paranasal sinuses are well-aerated. CT CERVICAL SPINE FINDINGS Cervical vertebral bodies and posterior elements intact and aligned with straightened cervical lordosis. Mild C6-7 disc height loss with ventral endplate spurring. C1-2 articulation maintained with severe arthropathy. No destructive bony lesions. Included prevertebral and paraspinal soft tissues are nonsuspicious. Nuchal ligament calcification and C7. Mild C3-4 neural foraminal narrowing. Mild RIGHT C6-7 neural foraminal narrowing. No osseous canal stenosis. IMPRESSION: CT HEAD: Negative. CT CERVICAL SPINE: Straightened cervical lordosis without acute fracture or malalignment. Electronically Signed   By: Elon Alas M.D.   On: 07/05/2015 01:21   I have personally reviewed and evaluated these images and lab results as part of my medical decision-making.   EKG Interpretation   Date/Time:  Thursday July 04 2015 22:03:19 EST Ventricular Rate:  79 PR Interval:  122 QRS Duration: 101 QT Interval:  433 QTC Calculation: 496 R Axis:   52 Text Interpretation:  Sinus rhythm Abnormal R-wave progression, early  transition Borderline T wave abnormalities Borderline prolonged QT  interval Confirmed by Wilson Singer  MD, STEPHEN ([id number]) on 07/04/2015 10:33:27 PM       MDM   Final diagnoses:  Hand paresthesia, unspecified laterality  Hypokalemia  Hypocalcemia  Hypomagnesemia  Bradycardia  Essential hypertension   Plan admission  CRITICAL CARE Performed by: Rolland Porter L Total critical care time: 35 minutes Critical care time was exclusive of separately billable procedures and treating other patients. Critical care was necessary to treat or prevent imminent or life-threatening deterioration. Critical care was time spent personally by me on the following activities: development of treatment plan with patient and/or surrogate as well as nursing, discussions with consultants, evaluation of patient's response to treatment, examination of patient, obtaining history from patient or surrogate, ordering and performing treatments and interventions, ordering and review of laboratory studies, ordering and review of radiographic studies, pulse oximetry and re-evaluation of patient's condition.    I personally performed the services described in this documentation, which was scribed in my presence. The recorded information has been reviewed and considered.  Rolland Porter, MD, Barbette Or, MD 07/05/15 [phone]

## 2015-07-05 ENCOUNTER — Emergency Department (HOSPITAL_COMMUNITY): Payer: Medicare Other

## 2015-07-05 ENCOUNTER — Encounter (HOSPITAL_COMMUNITY): Payer: Self-pay | Admitting: Emergency Medicine

## 2015-07-05 DIAGNOSIS — E1121 Type 2 diabetes mellitus with diabetic nephropathy: Secondary | ICD-10-CM | POA: Diagnosis not present

## 2015-07-05 DIAGNOSIS — I10 Essential (primary) hypertension: Secondary | ICD-10-CM

## 2015-07-05 DIAGNOSIS — E876 Hypokalemia: Secondary | ICD-10-CM | POA: Diagnosis not present

## 2015-07-05 DIAGNOSIS — R202 Paresthesia of skin: Secondary | ICD-10-CM | POA: Diagnosis present

## 2015-07-05 DIAGNOSIS — R200 Anesthesia of skin: Secondary | ICD-10-CM | POA: Diagnosis not present

## 2015-07-05 DIAGNOSIS — E119 Type 2 diabetes mellitus without complications: Secondary | ICD-10-CM

## 2015-07-05 DIAGNOSIS — R531 Weakness: Secondary | ICD-10-CM | POA: Diagnosis not present

## 2015-07-05 LAB — CBC WITH DIFFERENTIAL/PLATELET
Basophils Absolute: 0 10*3/uL (ref 0.0–0.1)
Basophils Relative: 0 %
Eosinophils Absolute: 0.1 10*3/uL (ref 0.0–0.7)
Eosinophils Relative: 2 %
HCT: 32 % — ABNORMAL LOW (ref 36.0–46.0)
Hemoglobin: 10 g/dL — ABNORMAL LOW (ref 12.0–15.0)
Lymphocytes Relative: 34 %
Lymphs Abs: 2.6 10*3/uL (ref 0.7–4.0)
MCH: 23.3 pg — ABNORMAL LOW (ref 26.0–34.0)
MCHC: 31.3 g/dL (ref 30.0–36.0)
MCV: 74.6 fL — ABNORMAL LOW (ref 78.0–100.0)
Monocytes Absolute: 0.5 10*3/uL (ref 0.1–1.0)
Monocytes Relative: 7 %
Neutro Abs: 4.4 10*3/uL (ref 1.7–7.7)
Neutrophils Relative %: 57 %
Platelets: 261 10*3/uL (ref 150–400)
RBC: 4.29 MIL/uL (ref 3.87–5.11)
RDW: 15.7 % — ABNORMAL HIGH (ref 11.5–15.5)
WBC: 7.6 10*3/uL (ref 4.0–10.5)

## 2015-07-05 LAB — BASIC METABOLIC PANEL
Anion gap: 9 (ref 5–15)
BUN: 16 mg/dL (ref 6–20)
CO2: 29 mmol/L (ref 22–32)
Calcium: 7.2 mg/dL — ABNORMAL LOW (ref 8.9–10.3)
Chloride: 104 mmol/L (ref 101–111)
Creatinine, Ser: 0.92 mg/dL (ref 0.44–1.00)
GFR calc Af Amer: >60 mL/min (ref >60)
GFR calc non Af Amer: >60 mL/min (ref >60)
Glucose, Bld: 107 mg/dL — ABNORMAL HIGH (ref 65–99)
Potassium: 3.7 mmol/L (ref 3.5–5.1)
Sodium: 142 mmol/L (ref 135–145)

## 2015-07-05 LAB — COMPREHENSIVE METABOLIC PANEL
ALT: 11 U/L — ABNORMAL LOW (ref 14–54)
AST: 20 U/L (ref 15–41)
Albumin: 3.9 g/dL (ref 3.5–5.0)
Alkaline Phosphatase: 45 U/L (ref 38–126)
Anion gap: 11 (ref 5–15)
BUN: 18 mg/dL (ref 6–20)
CO2: 28 mmol/L (ref 22–32)
Calcium: 6.3 mg/dL — CL (ref 8.9–10.3)
Chloride: 100 mmol/L — ABNORMAL LOW (ref 101–111)
Creatinine, Ser: 0.95 mg/dL (ref 0.44–1.00)
GFR calc Af Amer: >60 mL/min (ref >60)
GFR calc non Af Amer: 60 mL/min — ABNORMAL LOW (ref >60)
Glucose, Bld: 103 mg/dL — ABNORMAL HIGH (ref 65–99)
Potassium: 2.7 mmol/L — CL (ref 3.5–5.1)
Sodium: 139 mmol/L (ref 135–145)
Total Bilirubin: 0.4 mg/dL (ref 0.3–1.2)
Total Protein: 7.5 g/dL (ref 6.5–8.1)

## 2015-07-05 LAB — PHOSPHORUS
Phosphorus: 3.2 mg/dL (ref 2.5–4.6)
Phosphorus: 3 mg/dL (ref 2.5–4.6)

## 2015-07-05 LAB — CBC
HCT: 32.8 % — ABNORMAL LOW (ref 36.0–46.0)
Hemoglobin: 9.9 g/dL — ABNORMAL LOW (ref 12.0–15.0)
MCH: 22.7 pg — ABNORMAL LOW (ref 26.0–34.0)
MCHC: 30.2 g/dL (ref 30.0–36.0)
MCV: 75.2 fL — ABNORMAL LOW (ref 78.0–100.0)
Platelets: 257 10*3/uL (ref 150–400)
RBC: 4.36 MIL/uL (ref 3.87–5.11)
RDW: 15.7 % — ABNORMAL HIGH (ref 11.5–15.5)
WBC: 4.6 10*3/uL (ref 4.0–10.5)

## 2015-07-05 LAB — MAGNESIUM
Magnesium: 0.5 mg/dL — CL (ref 1.7–2.4)
Magnesium: 2.1 mg/dL (ref 1.7–2.4)

## 2015-07-05 LAB — GLUCOSE, CAPILLARY
Glucose-Capillary: 95 mg/dL (ref 65–99)
Glucose-Capillary: 186 mg/dL — ABNORMAL HIGH (ref 65–99)

## 2015-07-05 LAB — CBG MONITORING, ED
Glucose-Capillary: 87 mg/dL (ref 65–99)
Glucose-Capillary: 149 mg/dL — ABNORMAL HIGH (ref 65–99)

## 2015-07-05 LAB — TROPONIN I: Troponin I: <0.03 ng/mL (ref <0.031)

## 2015-07-05 MED ORDER — MAGNESIUM SULFATE 2 GM/50ML IV SOLN
2.0000 g | Freq: Once | INTRAVENOUS | Status: AC
  Administered 2015-07-05: 2 g via INTRAVENOUS
  Filled 2015-07-05: qty 50

## 2015-07-05 MED ORDER — ONDANSETRON HCL 4 MG/2ML IJ SOLN: 4.0000 mg | Freq: Four times a day (QID) | INTRAMUSCULAR | Status: DC | PRN

## 2015-07-05 MED ORDER — HYDROCHLOROTHIAZIDE 25 MG PO TABS
25.0000 mg | ORAL_TABLET | Freq: Every day | ORAL | Status: DC
  Administered 2015-07-05 – 2015-07-07 (×3): 25 mg via ORAL
  Filled 2015-07-05 (×3): qty 1

## 2015-07-05 MED ORDER — INSULIN ASPART 100 UNIT/ML ~~LOC~~ SOLN
0.0000 [IU] | Freq: Three times a day (TID) | SUBCUTANEOUS | Status: DC
  Administered 2015-07-05: 1 [IU] via SUBCUTANEOUS
  Filled 2015-07-05: qty 1

## 2015-07-05 MED ORDER — ONDANSETRON HCL 4 MG PO TABS: 4.0000 mg | ORAL_TABLET | Freq: Four times a day (QID) | ORAL | Status: DC | PRN

## 2015-07-05 MED ORDER — LOSARTAN POTASSIUM 50 MG PO TABS
100.0000 mg | ORAL_TABLET | Freq: Every day | ORAL | Status: DC
  Administered 2015-07-05 – 2015-07-07 (×3): 100 mg via ORAL
  Filled 2015-07-05 (×6): qty 2

## 2015-07-05 MED ORDER — SODIUM CHLORIDE 0.9 % IV SOLN
1.0000 g | Freq: Once | INTRAVENOUS | Status: AC
  Administered 2015-07-05: 1 g via INTRAVENOUS
  Filled 2015-07-05: qty 10

## 2015-07-05 MED ORDER — SODIUM CHLORIDE 0.9% FLUSH: 3.0000 mL | INTRAVENOUS | Status: DC | PRN

## 2015-07-05 MED ORDER — LOSARTAN POTASSIUM-HCTZ 100-25 MG PO TABS: 1.0000 | ORAL_TABLET | Freq: Every day | ORAL | Status: DC

## 2015-07-05 MED ORDER — POTASSIUM CHLORIDE CRYS ER 20 MEQ PO TBCR
40.0000 meq | EXTENDED_RELEASE_TABLET | Freq: Once | ORAL | Status: AC
  Administered 2015-07-05: 40 meq via ORAL
  Filled 2015-07-05: qty 2

## 2015-07-05 MED ORDER — CLONIDINE HCL 0.1 MG PO TABS
0.1000 mg | ORAL_TABLET | Freq: Once | ORAL | Status: AC
  Administered 2015-07-05: 0.1 mg via ORAL
  Filled 2015-07-05: qty 1

## 2015-07-05 MED ORDER — POTASSIUM CHLORIDE 10 MEQ/100ML IV SOLN
10.0000 meq | INTRAVENOUS | Status: AC
  Administered 2015-07-05 (×2): 10 meq via INTRAVENOUS
  Filled 2015-07-05 (×2): qty 100

## 2015-07-05 MED ORDER — SODIUM CHLORIDE 0.9 % IV SOLN: 250.0000 mL | INTRAVENOUS | Status: DC | PRN

## 2015-07-05 MED ORDER — PANTOPRAZOLE SODIUM 40 MG PO TBEC
40.0000 mg | DELAYED_RELEASE_TABLET | Freq: Every day | ORAL | Status: DC
  Administered 2015-07-05 – 2015-07-07 (×3): 40 mg via ORAL
  Filled 2015-07-05 (×3): qty 1

## 2015-07-05 MED ORDER — CALCIUM GLUCONATE 10 % IV SOLN
1.0000 g | Freq: Once | INTRAVENOUS | Status: AC
  Administered 2015-07-05: 1 g via INTRAVENOUS
  Filled 2015-07-05: qty 10

## 2015-07-05 MED ORDER — SODIUM CHLORIDE 0.9% FLUSH
3.0000 mL | Freq: Two times a day (BID) | INTRAVENOUS | Status: DC
  Administered 2015-07-05 – 2015-07-06 (×3): 3 mL via INTRAVENOUS

## 2015-07-05 MED ORDER — HYDRALAZINE HCL 25 MG PO TABS
50.0000 mg | ORAL_TABLET | Freq: Two times a day (BID) | ORAL | Status: DC
  Administered 2015-07-05 – 2015-07-07 (×5): 50 mg via ORAL
  Filled 2015-07-05 (×11): qty 2

## 2015-07-05 NOTE — ED Notes (Signed)
Pt repositioned in chair. Pt given warm blanket.nad noted.

## 2015-07-05 NOTE — ED Notes (Signed)
Lab at bedside for am redraw. Per AM report, pt to be discharged if am labs within normal limits.

## 2015-07-05 NOTE — ED Notes (Signed)
Pt given breakfast tray. POC CBG 87.

## 2015-07-05 NOTE — ED Notes (Signed)
Pt to floor via stretcher- she denies pain or any discomfort

## 2015-07-05 NOTE — H&P (Signed)
PCP:   FANTA,TESFAYE, MD   Chief Complaint:  Numbness and tingling  HPI: 70 yo female with h/o dm, htn comes in with over one week of worsening numbness and tingling across her chest and down her arms with these "spasms" that happening to her hands.  She denies any n/v/d.  She says she has a pretty good diabetic diet.  No swelling anywehre.  No electrolyte issues before.  Pt found to have low ca, mag and potassium levels.  Referred for admission for repletion.  Review of Systems:  Positive and negative as per HPI otherwise all other systems are negative  Past Medical History: Past Medical History  Diagnosis Date  . Diabetes mellitus without complication ([place])   . Hypertension    Past Surgical History  Procedure Laterality Date  . Abdominal hysterectomy      Medications: Prior to Admission medications   Medication Sig Start Date End Date Taking? Authorizing Provider  ciprofloxacin (CIPRO) 250 MG tablet Take 250 mg by mouth daily. 06/25/15  Yes Historical Provider, MD  esomeprazole (NEXIUM) 40 MG capsule Take 40 mg by mouth daily at 12 noon.   Yes Historical Provider, MD  gabapentin (NEURONTIN) 100 MG capsule Take 100 mg by mouth at bedtime.   Yes Historical Provider, MD  hydrALAZINE (APRESOLINE) 50 MG tablet Take 50 mg by mouth 2 (two) times daily.   Yes Historical Provider, MD  losartan-hydrochlorothiazide (HYZAAR) 100-25 MG tablet Take 1 tablet by mouth daily.   Yes Historical Provider, MD  metFORMIN (GLUCOPHAGE) 500 MG tablet Take 500 mg by mouth 2 (two) times daily with a meal.   Yes Historical Provider, MD  potassium chloride (K-DUR,KLOR-CON) 10 MEQ tablet Take 10 mEq by mouth 2 (two) times daily.   Yes Historical Provider, MD  rosuvastatin (CRESTOR) 20 MG tablet Take 20 mg by mouth daily.   Yes Historical Provider, MD    Allergies:   Allergies  Allergen Reactions  . Benadryl [Diphenhydramine Hcl (Sleep)] Nausea And Vomiting  . Sudafed [Pseudoephedrine Hcl]     Social  History:  reports that she has never smoked. She does not have any smokeless tobacco history on file. She reports that she does not drink alcohol or use illicit drugs.  Family History: No premature CAD  Physical Exam: Filed Vitals:   07/05/15 0042 07/05/15 0100 07/05/15 0130 07/05/15 0200  BP: 201/115 203/110 161/94 130/81  Pulse:  68 80 77  Temp:      TempSrc:      Resp:  13 16 18   Height:      Weight:      SpO2:  96% 95% 97%   General appearance: alert, cooperative and no distress Head: Normocephalic, without obvious abnormality, atraumatic Eyes: negative Nose: Nares normal. Septum midline. Mucosa normal. No drainage or sinus tenderness. Neck: no JVD and supple, symmetrical, trachea midline Lungs: clear to auscultation bilaterally Heart: regular rate and rhythm, S1, S2 normal, no murmur, click, rub or gallop Abdomen: soft, non-tender; bowel sounds normal; no masses,  no organomegaly Extremities: extremities normal, atraumatic, no cyanosis or edema Pulses: 2+ and symmetric Skin: Skin color, texture, turgor normal. No rashes or lesions Neurologic: Grossly normal  Carpopedal spasm present    Labs on Admission:   Recent Labs  07/05/15 0111  NA 139  K 2.7*  CL 100*  CO2 28  GLUCOSE 103*  BUN 18  CREATININE 0.95  CALCIUM 6.3*  MG 0.5*    Recent Labs  07/05/15 0111  AST 20  ALT 11*  ALKPHOS 45  BILITOT 0.4  PROT 7.5  ALBUMIN 3.9     Recent Labs  07/05/15 0111  WBC 7.6  NEUTROABS 4.4  HGB 10.0*  HCT 32.0*  MCV 74.6*  PLT 261    Recent Labs  07/05/15 0111  TROPONINI <0.03    Radiological Exams on Admission: Dg Chest 2 View  07/05/2015  CLINICAL DATA:  Upper extremity numbness and generalized weakness for 2 weeks. History of hypertension, diabetes. EXAM: CHEST  2 VIEW COMPARISON:  None available for comparison at time of study interpretation. FINDINGS: The cardiac silhouette is mildly enlarged. Tortuous aorta can be associated with hypertension.  No pleural effusion or focal consolidation. No pneumothorax. Soft tissue planes and included osseous structure nonsuspicious. IMPRESSION: Mild cardiomegaly, no acute pulmonary process. Electronically Signed   By: Elon Alas M.D.   On: 07/05/2015 01:17   Ct Head Wo Contrast  07/05/2015  CLINICAL DATA:  Upper extremity numbness and generalized weakness for 2 weeks. History of hypertension, diabetes. EXAM: CT HEAD WITHOUT CONTRAST CT CERVICAL SPINE WITHOUT CONTRAST TECHNIQUE: Multidetector CT imaging of the head and cervical spine was performed following the standard protocol without intravenous contrast. Multiplanar CT image reconstructions of the cervical spine were also generated. COMPARISON:  None. FINDINGS: CT HEAD FINDINGS The ventricles and sulci are normal for age. No intraparenchymal hemorrhage, mass effect nor midline shift. No acute large vascular territory infarcts. No abnormal extra-axial fluid collections. Basal cisterns are patent. Mild calcific atherosclerosis of the carotid siphons. No skull fracture. The included ocular globes and orbital contents are non-suspicious. The mastoid aircells and included paranasal sinuses are well-aerated. CT CERVICAL SPINE FINDINGS Cervical vertebral bodies and posterior elements intact and aligned with straightened cervical lordosis. Mild C6-7 disc height loss with ventral endplate spurring. C1-2 articulation maintained with severe arthropathy. No destructive bony lesions. Included prevertebral and paraspinal soft tissues are nonsuspicious. Nuchal ligament calcification and C7. Mild C3-4 neural foraminal narrowing. Mild RIGHT C6-7 neural foraminal narrowing. No osseous canal stenosis. IMPRESSION: CT HEAD: Negative. CT CERVICAL SPINE: Straightened cervical lordosis without acute fracture or malalignment. Electronically Signed   By: Elon Alas M.D.   On: 07/05/2015 01:21   Ct Cervical Spine Wo Contrast  07/05/2015  CLINICAL DATA:  Upper extremity  numbness and generalized weakness for 2 weeks. History of hypertension, diabetes. EXAM: CT HEAD WITHOUT CONTRAST CT CERVICAL SPINE WITHOUT CONTRAST TECHNIQUE: Multidetector CT imaging of the head and cervical spine was performed following the standard protocol without intravenous contrast. Multiplanar CT image reconstructions of the cervical spine were also generated. COMPARISON:  None. FINDINGS: CT HEAD FINDINGS The ventricles and sulci are normal for age. No intraparenchymal hemorrhage, mass effect nor midline shift. No acute large vascular territory infarcts. No abnormal extra-axial fluid collections. Basal cisterns are patent. Mild calcific atherosclerosis of the carotid siphons. No skull fracture. The included ocular globes and orbital contents are non-suspicious. The mastoid aircells and included paranasal sinuses are well-aerated. CT CERVICAL SPINE FINDINGS Cervical vertebral bodies and posterior elements intact and aligned with straightened cervical lordosis. Mild C6-7 disc height loss with ventral endplate spurring. C1-2 articulation maintained with severe arthropathy. No destructive bony lesions. Included prevertebral and paraspinal soft tissues are nonsuspicious. Nuchal ligament calcification and C7. Mild C3-4 neural foraminal narrowing. Mild RIGHT C6-7 neural foraminal narrowing. No osseous canal stenosis. IMPRESSION: CT HEAD: Negative. CT CERVICAL SPINE: Straightened cervical lordosis without acute fracture or malalignment. Electronically Signed   By: Elon Alas M.D.   On: 07/05/2015 01:21  ekg reviewed nsr no acute issues Old chart reviewed cxr reviwed no edema or infiltrate  Assessment/Plan  70 yo female with neurological symptoms from profound electrolyte abnormalites  Principal Problem:   Hypomagnesemia- replete mag sulfate 4 gm total now.    Active Problems:   Essential hypertension- noted better with po clonodine   Hand paresthesia/spasm- likely due to low ca levels    Hypocalcemia-  Check pth levels.  Give calcium gluconate 2 gm total now   Hypokalemia- replete iv and po route   Diabetes mellitus without complication ([place])- ssi  Admit to stepdown due to severity of lyte abnormalities.  Check phos level.  Repeat levels after all the above iv repletion done.  Full code.  Morgan Keinath A 07/05/2015, 3:58 AM

## 2015-07-05 NOTE — ED Notes (Signed)
CRITICAL VALUE ALERT  Critical value received:  K+ 2.7, Ca+ 6.3, Mg 0.5  Date of notification:  07/05/15  Time of notification:  0207  Critical value read back:Yes.    Nurse who received alert:  Charlies Silvers  MD notified (1st page):  Tomi Bamberger  Time of first page:  0208

## 2015-07-05 NOTE — ED Notes (Signed)
Pt requesting nexium dose. No admitting order for dose. Pt given meal tray but reports always takes nexium dose prior to meals. Pt reports burning sensation in stomach. nad noted. Dr. Legrand Rams paged.

## 2015-07-05 NOTE — ED Notes (Signed)
CBG 149. 

## 2015-07-06 LAB — GLUCOSE, CAPILLARY
Glucose-Capillary: 110 mg/dL — ABNORMAL HIGH (ref 65–99)
Glucose-Capillary: 96 mg/dL (ref 65–99)
Glucose-Capillary: 155 mg/dL — ABNORMAL HIGH (ref 65–99)
Glucose-Capillary: 190 mg/dL — ABNORMAL HIGH (ref 65–99)

## 2015-07-06 LAB — CALCIUM, IONIZED: Calcium, Ionized, Serum: 3.3 mg/dL — ABNORMAL LOW (ref 4.5–5.6)

## 2015-07-06 LAB — MRSA PCR SCREENING: MRSA by PCR: NEGATIVE

## 2015-07-06 LAB — PARATHYROID HORMONE, INTACT (NO CA): PTH: 20 pg/mL (ref 15–65)

## 2015-07-06 MED ORDER — POTASSIUM CHLORIDE CRYS ER 20 MEQ PO TBCR
40.0000 meq | EXTENDED_RELEASE_TABLET | Freq: Every day | ORAL | Status: DC
  Administered 2015-07-06 – 2015-07-07 (×2): 40 meq via ORAL
  Filled 2015-07-06 (×2): qty 2

## 2015-07-06 MED ORDER — ALUM & MAG HYDROXIDE-SIMETH 200-200-20 MG/5ML PO SUSP
30.0000 mL | ORAL | Status: DC | PRN
  Administered 2015-07-06: 30 mL via ORAL
  Filled 2015-07-06: qty 30

## 2015-07-06 MED ORDER — MAGNESIUM OXIDE 400 (241.3 MG) MG PO TABS
400.0000 mg | ORAL_TABLET | Freq: Two times a day (BID) | ORAL | Status: DC
  Administered 2015-07-06 – 2015-07-07 (×3): 400 mg via ORAL
  Filled 2015-07-06 (×3): qty 1

## 2015-07-06 NOTE — Progress Notes (Signed)
Pt transferred to 3A 333 via wheelchair by ICU NT.  Pt oriented to room environment.  Pt without complaints, in no acute distress.  Encouraged to call if need OOB.  Will continue to monitor pt.

## 2015-07-06 NOTE — Progress Notes (Signed)
Subjective: Patient was admitted due to severe numbness and tingling of her extremities. Her K+ and MG++ was very low. Patient is supplemented and she is feeling better. No seizure episode.  Objective: Vital signs in last 24 hours: Temp:  [97.7 F (36.5 C)-99.2 F (37.3 C)] 98.1 F (36.7 C) (02/18 0558) Pulse Rate:  [29-116] 71 (02/18 0558) Resp:  [13-22] 20 (02/18 0558) BP: (137-198)/(59-115) 147/72 mmHg (02/18 0558) SpO2:  [92 %-100 %] 99 % (02/18 0558) Weight:  [74.617 kg (164 lb 8 oz)] 74.617 kg (164 lb 8 oz) (02/18 0558) Weight change: -0.227 kg (-8 oz) Last BM Date: 07/05/15  Intake/Output from previous day:    PHYSICAL EXAM General appearance: alert and no distress Resp: clear to auscultation bilaterally Cardio: S1, S2 normal GI: soft, non-tender; bowel sounds normal; no masses,  no organomegaly Extremities: extremities normal, atraumatic, no cyanosis or edema  Lab Results:  Results for orders placed or performed during the hospital encounter of 07/04/15 (from the past 48 hour(s))  Comprehensive metabolic panel     Status: Abnormal   Collection Time: 07/05/15  1:11 AM  Result Value Ref Range   Sodium 139 135 - 145 mmol/L   Potassium 2.7 (LL) 3.5 - 5.1 mmol/L    Comment: CRITICAL RESULT CALLED TO, READ BACK BY AND VERIFIED WITH: VICK K AT 0206 ON [id number] BY FORSYTH K    Chloride 100 (L) 101 - 111 mmol/L   CO2 28 22 - 32 mmol/L   Glucose, Bld 103 (H) 65 - 99 mg/dL   BUN 18 6 - 20 mg/dL   Creatinine, Ser 0.95 0.44 - 1.00 mg/dL   Calcium 6.3 (LL) 8.9 - 10.3 mg/dL    Comment: CRITICAL RESULT CALLED TO, READ BACK BY AND VERIFIED WITH: VICK K AT 0206 ON [id number] BY FORSYTH K    Total Protein 7.5 6.5 - 8.1 g/dL   Albumin 3.9 3.5 - 5.0 g/dL   AST 20 15 - 41 U/L   ALT 11 (L) 14 - 54 U/L   Alkaline Phosphatase 45 38 - 126 U/L   Total Bilirubin 0.4 0.3 - 1.2 mg/dL   GFR calc non Af Amer 60 (L) >60 mL/min   GFR calc Af Amer >60 >60 mL/min    Comment: (NOTE) The eGFR has  been calculated using the CKD EPI equation. This calculation has not been validated in all clinical situations. eGFR's persistently <60 mL/min signify possible Chronic Kidney Disease.    Anion gap 11 5 - 15  CBC with Differential     Status: Abnormal   Collection Time: 07/05/15  1:11 AM  Result Value Ref Range   WBC 7.6 4.0 - 10.5 K/uL   RBC 4.29 3.87 - 5.11 MIL/uL   Hemoglobin 10.0 (L) 12.0 - 15.0 g/dL   HCT 32.0 (L) 36.0 - 46.0 %   MCV 74.6 (L) 78.0 - 100.0 fL   MCH 23.3 (L) 26.0 - 34.0 pg   MCHC 31.3 30.0 - 36.0 g/dL   RDW 15.7 (H) 11.5 - 15.5 %   Platelets 261 150 - 400 K/uL   Neutrophils Relative % 57 %   Neutro Abs 4.4 1.7 - 7.7 K/uL   Lymphocytes Relative 34 %   Lymphs Abs 2.6 0.7 - 4.0 K/uL   Monocytes Relative 7 %   Monocytes Absolute 0.5 0.1 - 1.0 K/uL   Eosinophils Relative 2 %   Eosinophils Absolute 0.1 0.0 - 0.7 K/uL   Basophils Relative 0 %   Basophils  Absolute 0.0 0.0 - 0.1 K/uL  Magnesium     Status: Abnormal   Collection Time: 07/05/15  1:11 AM  Result Value Ref Range   Magnesium 0.5 (LL) 1.7 - 2.4 mg/dL    Comment: CRITICAL RESULT CALLED TO, READ BACK BY AND VERIFIED WITH: VICK K AT 0206 ON [id number] BY FORSYTH K   Troponin I     Status: None   Collection Time: 07/05/15  1:11 AM  Result Value Ref Range   Troponin I <0.03 <0.031 ng/mL    Comment:        NO INDICATION OF MYOCARDIAL INJURY.   Parathyroid hormone, intact (no Ca)     Status: None   Collection Time: 07/05/15  1:11 AM  Result Value Ref Range   PTH 20 15 - 65 pg/mL    Comment: (NOTE) Performed At: [facility] [place], [place] [id number] Lindon Romp MD GQ:[id number]   Phosphorus     Status: None   Collection Time: 07/05/15  6:56 AM  Result Value Ref Range   Phosphorus 3.2 2.5 - 4.6 mg/dL  Basic metabolic panel     Status: Abnormal   Collection Time: 07/05/15  6:56 AM  Result Value Ref Range   Sodium 142 135 - 145 mmol/L   Potassium 3.7 3.5 - 5.1 mmol/L     Comment: DELTA CHECK NOTED   Chloride 104 101 - 111 mmol/L   CO2 29 22 - 32 mmol/L   Glucose, Bld 107 (H) 65 - 99 mg/dL   BUN 16 6 - 20 mg/dL   Creatinine, Ser 0.92 0.44 - 1.00 mg/dL   Calcium 7.2 (L) 8.9 - 10.3 mg/dL   GFR calc non Af Amer >60 >60 mL/min   GFR calc Af Amer >60 >60 mL/min    Comment: (NOTE) The eGFR has been calculated using the CKD EPI equation. This calculation has not been validated in all clinical situations. eGFR's persistently <60 mL/min signify possible Chronic Kidney Disease.    Anion gap 9 5 - 15  CBC     Status: Abnormal   Collection Time: 07/05/15  6:56 AM  Result Value Ref Range   WBC 4.6 4.0 - 10.5 K/uL   RBC 4.36 3.87 - 5.11 MIL/uL   Hemoglobin 9.9 (L) 12.0 - 15.0 g/dL   HCT 32.8 (L) 36.0 - 46.0 %   MCV 75.2 (L) 78.0 - 100.0 fL   MCH 22.7 (L) 26.0 - 34.0 pg   MCHC 30.2 30.0 - 36.0 g/dL   RDW 15.7 (H) 11.5 - 15.5 %   Platelets 257 150 - 400 K/uL  Magnesium     Status: None   Collection Time: 07/05/15  6:56 AM  Result Value Ref Range   Magnesium 2.1 1.7 - 2.4 mg/dL  CBG monitoring, ED     Status: None   Collection Time: 07/05/15  9:51 AM  Result Value Ref Range   Glucose-Capillary 87 65 - 99 mg/dL  CBG monitoring, ED     Status: Abnormal   Collection Time: 07/05/15 12:49 PM  Result Value Ref Range   Glucose-Capillary 149 (H) 65 - 99 mg/dL  Glucose, capillary     Status: None   Collection Time: 07/05/15  4:50 PM  Result Value Ref Range   Glucose-Capillary 95 65 - 99 mg/dL  Phosphorus     Status: None   Collection Time: 07/05/15  6:27 PM  Result Value Ref Range   Phosphorus 3.0 2.5 - 4.6 mg/dL  MRSA PCR Screening     Status: None   Collection Time: 07/05/15  8:33 PM  Result Value Ref Range   MRSA by PCR NEGATIVE NEGATIVE    Comment:        The GeneXpert MRSA Assay (FDA approved for NASAL specimens only), is one component of a comprehensive MRSA colonization surveillance program. It is not intended to diagnose MRSA infection nor to  guide or monitor treatment for MRSA infections.   Glucose, capillary     Status: Abnormal   Collection Time: 07/05/15  8:35 PM  Result Value Ref Range   Glucose-Capillary 186 (H) 65 - 99 mg/dL   Comment 1 Notify RN    Comment 2 Document in Chart   Glucose, capillary     Status: Abnormal   Collection Time: 07/06/15  8:28 AM  Result Value Ref Range   Glucose-Capillary 110 (H) 65 - 99 mg/dL    ABGS No results for input(s): PHART, PO2ART, TCO2, HCO3 in the last 72 hours.  Invalid input(s): PCO2 CULTURES Recent Results (from the past 240 hour(s))  MRSA PCR Screening     Status: None   Collection Time: 07/05/15  8:33 PM  Result Value Ref Range Status   MRSA by PCR NEGATIVE NEGATIVE Final    Comment:        The GeneXpert MRSA Assay (FDA approved for NASAL specimens only), is one component of a comprehensive MRSA colonization surveillance program. It is not intended to diagnose MRSA infection nor to guide or monitor treatment for MRSA infections.    Studies/Results: Dg Chest 2 View  07/05/2015  CLINICAL DATA:  Upper extremity numbness and generalized weakness for 2 weeks. History of hypertension, diabetes. EXAM: CHEST  2 VIEW COMPARISON:  None available for comparison at time of study interpretation. FINDINGS: The cardiac silhouette is mildly enlarged. Tortuous aorta can be associated with hypertension. No pleural effusion or focal consolidation. No pneumothorax. Soft tissue planes and included osseous structure nonsuspicious. IMPRESSION: Mild cardiomegaly, no acute pulmonary process. Electronically Signed   By: Elon Alas M.D.   On: 07/05/2015 01:17   Ct Head Wo Contrast  07/05/2015  CLINICAL DATA:  Upper extremity numbness and generalized weakness for 2 weeks. History of hypertension, diabetes. EXAM: CT HEAD WITHOUT CONTRAST CT CERVICAL SPINE WITHOUT CONTRAST TECHNIQUE: Multidetector CT imaging of the head and cervical spine was performed following the standard protocol  without intravenous contrast. Multiplanar CT image reconstructions of the cervical spine were also generated. COMPARISON:  None. FINDINGS: CT HEAD FINDINGS The ventricles and sulci are normal for age. No intraparenchymal hemorrhage, mass effect nor midline shift. No acute large vascular territory infarcts. No abnormal extra-axial fluid collections. Basal cisterns are patent. Mild calcific atherosclerosis of the carotid siphons. No skull fracture. The included ocular globes and orbital contents are non-suspicious. The mastoid aircells and included paranasal sinuses are well-aerated. CT CERVICAL SPINE FINDINGS Cervical vertebral bodies and posterior elements intact and aligned with straightened cervical lordosis. Mild C6-7 disc height loss with ventral endplate spurring. C1-2 articulation maintained with severe arthropathy. No destructive bony lesions. Included prevertebral and paraspinal soft tissues are nonsuspicious. Nuchal ligament calcification and C7. Mild C3-4 neural foraminal narrowing. Mild RIGHT C6-7 neural foraminal narrowing. No osseous canal stenosis. IMPRESSION: CT HEAD: Negative. CT CERVICAL SPINE: Straightened cervical lordosis without acute fracture or malalignment. Electronically Signed   By: Elon Alas M.D.   On: 07/05/2015 01:21   Ct Cervical Spine Wo Contrast  07/05/2015  CLINICAL DATA:  Upper extremity numbness  and generalized weakness for 2 weeks. History of hypertension, diabetes. EXAM: CT HEAD WITHOUT CONTRAST CT CERVICAL SPINE WITHOUT CONTRAST TECHNIQUE: Multidetector CT imaging of the head and cervical spine was performed following the standard protocol without intravenous contrast. Multiplanar CT image reconstructions of the cervical spine were also generated. COMPARISON:  None. FINDINGS: CT HEAD FINDINGS The ventricles and sulci are normal for age. No intraparenchymal hemorrhage, mass effect nor midline shift. No acute large vascular territory infarcts. No abnormal extra-axial  fluid collections. Basal cisterns are patent. Mild calcific atherosclerosis of the carotid siphons. No skull fracture. The included ocular globes and orbital contents are non-suspicious. The mastoid aircells and included paranasal sinuses are well-aerated. CT CERVICAL SPINE FINDINGS Cervical vertebral bodies and posterior elements intact and aligned with straightened cervical lordosis. Mild C6-7 disc height loss with ventral endplate spurring. C1-2 articulation maintained with severe arthropathy. No destructive bony lesions. Included prevertebral and paraspinal soft tissues are nonsuspicious. Nuchal ligament calcification and C7. Mild C3-4 neural foraminal narrowing. Mild RIGHT C6-7 neural foraminal narrowing. No osseous canal stenosis. IMPRESSION: CT HEAD: Negative. CT CERVICAL SPINE: Straightened cervical lordosis without acute fracture or malalignment. Electronically Signed   By: Elon Alas M.D.   On: 07/05/2015 01:21    Medications: I have reviewed the patient's current medications.  Assesment:   Principal Problem:   Hypomagnesemia Active Problems:   Essential hypertension   Hand paresthesia   Hypocalcemia   Hypokalemia   Diabetes mellitus without complication ([facility])    Plan:  Medications reviewed Will continue supplementing electrolyte Will monitor CMP and Magnesium level Continue regular treatment    LOS: 1 day   Judeen Geralds 07/06/2015, 9:07 AM

## 2015-07-06 NOTE — Progress Notes (Signed)
Pt Iv was occluded so therefore it was removed. Pt wished not to be stuck again at this time because she stated that Dr.Fanta said she would most likely be discharged home tomorrow. Will continue to monitor.

## 2015-07-07 LAB — COMPREHENSIVE METABOLIC PANEL
ALT: 12 U/L — ABNORMAL LOW (ref 14–54)
AST: 18 U/L (ref 15–41)
Albumin: 4 g/dL (ref 3.5–5.0)
Alkaline Phosphatase: 47 U/L (ref 38–126)
Anion gap: 10 (ref 5–15)
BUN: 14 mg/dL (ref 6–20)
CO2: 25 mmol/L (ref 22–32)
Calcium: 7.7 mg/dL — ABNORMAL LOW (ref 8.9–10.3)
Chloride: 103 mmol/L (ref 101–111)
Creatinine, Ser: 0.92 mg/dL (ref 0.44–1.00)
GFR calc Af Amer: >60 mL/min (ref >60)
GFR calc non Af Amer: >60 mL/min (ref >60)
Glucose, Bld: 120 mg/dL — ABNORMAL HIGH (ref 65–99)
Potassium: 3.6 mmol/L (ref 3.5–5.1)
Sodium: 138 mmol/L (ref 135–145)
Total Bilirubin: 0.5 mg/dL (ref 0.3–1.2)
Total Protein: 8 g/dL (ref 6.5–8.1)

## 2015-07-07 LAB — MAGNESIUM: Magnesium: 1.9 mg/dL (ref 1.7–2.4)

## 2015-07-07 LAB — GLUCOSE, CAPILLARY: Glucose-Capillary: 104 mg/dL — ABNORMAL HIGH (ref 65–99)

## 2015-07-07 MED ORDER — POTASSIUM CHLORIDE CRYS ER 20 MEQ PO TBCR: 20.0000 meq | EXTENDED_RELEASE_TABLET | Freq: Every day | ORAL | Status: DC

## 2015-07-07 MED ORDER — MAGNESIUM OXIDE 400 (241.3 MG) MG PO TABS: 400.0000 mg | ORAL_TABLET | Freq: Two times a day (BID) | ORAL | Status: DC

## 2015-07-07 NOTE — Progress Notes (Signed)
Patient states understanding of discharge instructions.  

## 2015-07-07 NOTE — Discharge Summary (Signed)
Physician Discharge Summary  Patient ID: Sabrina Suarez MRN: [id number] DOB/AGE: 70-Mar-1947 70 y.o. Primary Care Physician:Niang Mitcheltree, MD Admit date: 07/04/2015 Discharge date: 07/07/2015    Discharge Diagnoses:   Principal Problem:   Hypomagnesemia Active Problems:   Essential hypertension   Hand paresthesia   Hypocalcemia   Hypokalemia   Diabetes mellitus without complication (HCC)     Medication List    STOP taking these medications        ciprofloxacin 250 MG tablet  Commonly known as:  CIPRO      TAKE these medications        esomeprazole 40 MG capsule  Commonly known as:  NEXIUM  Take 40 mg by mouth daily at 12 noon.     gabapentin 100 MG capsule  Commonly known as:  NEURONTIN  Take 100 mg by mouth at bedtime.     hydrALAZINE 50 MG tablet  Commonly known as:  APRESOLINE  Take 50 mg by mouth 2 (two) times daily.     losartan-hydrochlorothiazide 100-25 MG tablet  Commonly known as:  HYZAAR  Take 1 tablet by mouth daily.     magnesium oxide 400 (241.3 Mg) MG tablet  Commonly known as:  MAG-OX  Take 1 tablet (400 mg total) by mouth 2 (two) times daily.     metFORMIN 500 MG tablet  Commonly known as:  GLUCOPHAGE  Take 500 mg by mouth 2 (two) times daily with a meal.     potassium chloride SA 20 MEQ tablet  Commonly known as:  K-DUR,KLOR-CON  Take 1 tablet (20 mEq total) by mouth daily.     rosuvastatin 20 MG tablet  Commonly known as:  CRESTOR  Take 20 mg by mouth daily.        Discharged Condition: improved    Consults: none  Significant Diagnostic Studies: Dg Chest 2 View  07/05/2015  CLINICAL DATA:  Upper extremity numbness and generalized weakness for 2 weeks. History of hypertension, diabetes. EXAM: CHEST  2 VIEW COMPARISON:  None available for comparison at time of study interpretation. FINDINGS: The cardiac silhouette is mildly enlarged. Tortuous aorta can be associated with hypertension. No pleural effusion or focal consolidation.  No pneumothorax. Soft tissue planes and included osseous structure nonsuspicious. IMPRESSION: Mild cardiomegaly, no acute pulmonary process. Electronically Signed   By: Elon Alas M.D.   On: 07/05/2015 01:17   Ct Head Wo Contrast  07/05/2015  CLINICAL DATA:  Upper extremity numbness and generalized weakness for 2 weeks. History of hypertension, diabetes. EXAM: CT HEAD WITHOUT CONTRAST CT CERVICAL SPINE WITHOUT CONTRAST TECHNIQUE: Multidetector CT imaging of the head and cervical spine was performed following the standard protocol without intravenous contrast. Multiplanar CT image reconstructions of the cervical spine were also generated. COMPARISON:  None. FINDINGS: CT HEAD FINDINGS The ventricles and sulci are normal for age. No intraparenchymal hemorrhage, mass effect nor midline shift. No acute large vascular territory infarcts. No abnormal extra-axial fluid collections. Basal cisterns are patent. Mild calcific atherosclerosis of the carotid siphons. No skull fracture. The included ocular globes and orbital contents are non-suspicious. The mastoid aircells and included paranasal sinuses are well-aerated. CT CERVICAL SPINE FINDINGS Cervical vertebral bodies and posterior elements intact and aligned with straightened cervical lordosis. Mild C6-7 disc height loss with ventral endplate spurring. C1-2 articulation maintained with severe arthropathy. No destructive bony lesions. Included prevertebral and paraspinal soft tissues are nonsuspicious. Nuchal ligament calcification and C7. Mild C3-4 neural foraminal narrowing. Mild RIGHT C6-7 neural foraminal narrowing. No osseous canal  stenosis. IMPRESSION: CT HEAD: Negative. CT CERVICAL SPINE: Straightened cervical lordosis without acute fracture or malalignment. Electronically Signed   By: Elon Alas M.D.   On: 07/05/2015 01:21   Ct Cervical Spine Wo Contrast  07/05/2015  CLINICAL DATA:  Upper extremity numbness and generalized weakness for 2 weeks.  History of hypertension, diabetes. EXAM: CT HEAD WITHOUT CONTRAST CT CERVICAL SPINE WITHOUT CONTRAST TECHNIQUE: Multidetector CT imaging of the head and cervical spine was performed following the standard protocol without intravenous contrast. Multiplanar CT image reconstructions of the cervical spine were also generated. COMPARISON:  None. FINDINGS: CT HEAD FINDINGS The ventricles and sulci are normal for age. No intraparenchymal hemorrhage, mass effect nor midline shift. No acute large vascular territory infarcts. No abnormal extra-axial fluid collections. Basal cisterns are patent. Mild calcific atherosclerosis of the carotid siphons. No skull fracture. The included ocular globes and orbital contents are non-suspicious. The mastoid aircells and included paranasal sinuses are well-aerated. CT CERVICAL SPINE FINDINGS Cervical vertebral bodies and posterior elements intact and aligned with straightened cervical lordosis. Mild C6-7 disc height loss with ventral endplate spurring. C1-2 articulation maintained with severe arthropathy. No destructive bony lesions. Included prevertebral and paraspinal soft tissues are nonsuspicious. Nuchal ligament calcification and C7. Mild C3-4 neural foraminal narrowing. Mild RIGHT C6-7 neural foraminal narrowing. No osseous canal stenosis. IMPRESSION: CT HEAD: Negative. CT CERVICAL SPINE: Straightened cervical lordosis without acute fracture or malalignment. Electronically Signed   By: Elon Alas M.D.   On: 07/05/2015 01:21   Dg Bone Density  06/12/2015  EXAM: DUAL X-RAY ABSORPTIOMETRY (DXA) FOR BONE MINERAL DENSITY IMPRESSION: Ordering Physician:  Dr. Rosita Fire, Your patient [name] completed a BMD test on 06/12/2015 using the [employer] (software version: 14.10) manufactured by [employer]. The following summarizes the results of our evaluation. PATIENT BIOGRAPHICAL: Name: Sabrina Suarez Patient ID: [id number] Birth Date: 1945/09/03  Height: 64.0 in. Gender: Female Exam Date: 06/12/2015 Weight: 165.0 lbs. Indications: Bilateral Oophrectomy, Low Calcium Intake, Post Menopausal Fractures: Toe Treatments: DENSITOMETRY RESULTS: Site      Region     Measured Date Measured Age WHO Classification Young Adult T-score BMD         %Change vs. Previous Significant Change (*) AP Spine L1-L3 6/12/2015 69.4 Normal -0.7 1.081 g/cm2 - - DualFemur Neck Right 06/12/2015 69.4 Normal -0.5 0.971 g/cm2 - - ASSESSMENT: BMD as determined from AP Spine L1-L3 is 1.081 g/cm2 with a T-Score of -0.7. This patient is considered normal according to [facility]) criteria. (L-4 was excluded due to advanced degenerative changes.)(Patient does not meet criteria for FRAX assessment.) World Health Organization [facility]) criteria for post-menopausal, Caucasian Women: Normal:       T-score at or above -1 SD Osteopenia:   T-score between -1 and -2.5 SD Osteoporosis: T-score at or below -2.5 SD RECOMMENDATIONS: [facility] recommends that FDA-approved medial therapies be considered in postmenopausal women and men age 45 or older with a: 1. Hip or vertebral (clinical or morphometric) fracture. 2. T-Score of < -2.5 at the spine or hip. 3. Ten-year fracture probability by FRAX of 3% or greater for hip fracture or 20% or greater for major osteoporotic fracture. All treatment decisions require clinical judgment and consideration of individual patient factors, including patient preferences, co-morbidities, previous drug use, risk factors not captured in the FRAX model (e.g. falls, vitamin D deficiency, increased bone turnover, interval significant decline in bone density) and possible under-or over-estimation of fracture risk by FRAX. All  patients should ensure an adequate intake of dietary calcium (1200 mg/d) and vitamin D (800 IU daily) unless contraindicated. FOLLOW-UP: People with diagnosed cases of osteoporosis or osteopenia should be regularly  tested for bone mineral density. For patients eligible for Medicare, routine testing is allowed once every 2 years. Testing frequency can be increased for patients who have rapidly progressing disease, or for those who are receiving medical therapy to restore bone mass. I have reviewed this report, and agree with the above findings [facility] Electronically Signed   By: Rolm Baptise M.D.   On: 06/12/2015 09:58    Lab Results: Basic Metabolic Panel:  Recent Labs  07/05/15 0656 07/05/15 1827 07/07/15 0741  NA 142  --  138  K 3.7  --  3.6  CL 104  --  103  CO2 29  --  25  GLUCOSE 107*  --  120*  BUN 16  --  14  CREATININE 0.92  --  0.92  CALCIUM 7.2*  --  7.7*  MG 2.1  --  1.9  PHOS 3.2 3.0  --    Liver Function Tests:  Recent Labs  07/05/15 0111 07/07/15 0741  AST 20 18  ALT 11* 12*  ALKPHOS 45 47  BILITOT 0.4 0.5  PROT 7.5 8.0  ALBUMIN 3.9 4.0     CBC:  Recent Labs  07/05/15 0111 07/05/15 0656  WBC 7.6 4.6  NEUTROABS 4.4  --   HGB 10.0* 9.9*  HCT 32.0* 32.8*  MCV 74.6* 75.2*  PLT 261 257    Recent Results (from the past 240 hour(s))  MRSA PCR Screening     Status: None   Collection Time: 07/05/15  8:33 PM  Result Value Ref Range Status   MRSA by PCR NEGATIVE NEGATIVE Final    Comment:        The GeneXpert MRSA Assay (FDA approved for NASAL specimens only), is one component of a comprehensive MRSA colonization surveillance program. It is not intended to diagnose MRSA infection nor to guide or monitor treatment for MRSA infections.      Hospital Course:   This is a 69 years old female with history of diabetes mellitus and hypertension was admitted due to numbness and tingling of her extremities. Patient was found to very low Magnesium, potassium and calcium level. Her electrolytes were replaced iv. Patient improved and discharge on oral magnesium and potassium.   Discharge Exam: Blood pressure 139/80, pulse 69, temperature 97.8 F  (36.6 C), temperature source Oral, resp. rate 18, height 5\' 4"  (1.626 m), weight 74.32 kg (163 lb 13.5 oz), SpO2 97 %.    Disposition:  Home.        Follow-up Information    Follow up with [facility], MD In 1 week.   Specialty:  Internal Medicine   Contact information:   [place] [address] [phone]       Signed: Rosita Fire   07/07/2015, 9:15 AM

## 2015-07-08 DIAGNOSIS — E1121 Type 2 diabetes mellitus with diabetic nephropathy: Secondary | ICD-10-CM | POA: Diagnosis not present

## 2015-07-08 DIAGNOSIS — E876 Hypokalemia: Secondary | ICD-10-CM | POA: Diagnosis not present

## 2015-07-15 DIAGNOSIS — E119 Type 2 diabetes mellitus without complications: Secondary | ICD-10-CM | POA: Diagnosis not present

## 2015-07-15 DIAGNOSIS — E1165 Type 2 diabetes mellitus with hyperglycemia: Secondary | ICD-10-CM | POA: Diagnosis not present

## 2015-07-15 DIAGNOSIS — E785 Hyperlipidemia, unspecified: Secondary | ICD-10-CM | POA: Diagnosis not present

## 2015-07-15 DIAGNOSIS — I10 Essential (primary) hypertension: Secondary | ICD-10-CM | POA: Diagnosis not present

## 2015-07-15 DIAGNOSIS — E876 Hypokalemia: Secondary | ICD-10-CM | POA: Diagnosis not present

## 2015-09-30 DIAGNOSIS — E119 Type 2 diabetes mellitus without complications: Secondary | ICD-10-CM | POA: Diagnosis not present

## 2015-09-30 DIAGNOSIS — K219 Gastro-esophageal reflux disease without esophagitis: Secondary | ICD-10-CM | POA: Diagnosis not present

## 2015-12-17 DIAGNOSIS — I10 Essential (primary) hypertension: Secondary | ICD-10-CM | POA: Diagnosis not present

## 2015-12-17 DIAGNOSIS — K219 Gastro-esophageal reflux disease without esophagitis: Secondary | ICD-10-CM | POA: Diagnosis not present

## 2015-12-17 DIAGNOSIS — E1121 Type 2 diabetes mellitus with diabetic nephropathy: Secondary | ICD-10-CM | POA: Diagnosis not present

## 2016-04-02 DIAGNOSIS — E119 Type 2 diabetes mellitus without complications: Secondary | ICD-10-CM | POA: Diagnosis not present

## 2016-04-02 DIAGNOSIS — E1165 Type 2 diabetes mellitus with hyperglycemia: Secondary | ICD-10-CM | POA: Diagnosis not present

## 2016-04-02 DIAGNOSIS — Z23 Encounter for immunization: Secondary | ICD-10-CM | POA: Diagnosis not present

## 2016-04-02 DIAGNOSIS — I10 Essential (primary) hypertension: Secondary | ICD-10-CM | POA: Diagnosis not present

## 2016-04-02 DIAGNOSIS — E784 Other hyperlipidemia: Secondary | ICD-10-CM | POA: Diagnosis not present

## 2016-04-02 DIAGNOSIS — E785 Hyperlipidemia, unspecified: Secondary | ICD-10-CM | POA: Diagnosis not present

## 2016-06-30 ENCOUNTER — Other Ambulatory Visit (HOSPITAL_COMMUNITY): Payer: Self-pay | Admitting: Internal Medicine

## 2016-06-30 DIAGNOSIS — I10 Essential (primary) hypertension: Secondary | ICD-10-CM | POA: Diagnosis not present

## 2016-06-30 DIAGNOSIS — Z0000 Encounter for general adult medical examination without abnormal findings: Secondary | ICD-10-CM | POA: Diagnosis not present

## 2016-06-30 DIAGNOSIS — E784 Other hyperlipidemia: Secondary | ICD-10-CM | POA: Diagnosis not present

## 2016-06-30 DIAGNOSIS — Z1231 Encounter for screening mammogram for malignant neoplasm of breast: Secondary | ICD-10-CM

## 2016-06-30 DIAGNOSIS — E119 Type 2 diabetes mellitus without complications: Secondary | ICD-10-CM | POA: Diagnosis not present

## 2016-06-30 DIAGNOSIS — E1121 Type 2 diabetes mellitus with diabetic nephropathy: Secondary | ICD-10-CM | POA: Diagnosis not present

## 2016-06-30 DIAGNOSIS — K219 Gastro-esophageal reflux disease without esophagitis: Secondary | ICD-10-CM | POA: Diagnosis not present

## 2016-06-30 DIAGNOSIS — E785 Hyperlipidemia, unspecified: Secondary | ICD-10-CM | POA: Diagnosis not present

## 2016-07-09 ENCOUNTER — Ambulatory Visit (HOSPITAL_COMMUNITY)
Admission: RE | Admit: 2016-07-09 | Discharge: 2016-07-09 | Disposition: A | Discharge status: Home / Self Care | Payer: Medicare Other | Source: Ambulatory Visit | Attending: Internal Medicine | Admitting: Internal Medicine

## 2016-07-09 DIAGNOSIS — Z1231 Encounter for screening mammogram for malignant neoplasm of breast: Secondary | ICD-10-CM

## 2016-07-13 DIAGNOSIS — Z86010 Personal history of colonic polyps: Secondary | ICD-10-CM | POA: Diagnosis not present

## 2016-07-13 DIAGNOSIS — R197 Diarrhea, unspecified: Secondary | ICD-10-CM | POA: Diagnosis not present

## 2016-07-13 DIAGNOSIS — E119 Type 2 diabetes mellitus without complications: Secondary | ICD-10-CM | POA: Diagnosis not present

## 2016-08-04 DIAGNOSIS — K635 Polyp of colon: Secondary | ICD-10-CM | POA: Diagnosis not present

## 2016-08-04 DIAGNOSIS — D120 Benign neoplasm of cecum: Secondary | ICD-10-CM | POA: Diagnosis not present

## 2016-08-04 DIAGNOSIS — D122 Benign neoplasm of ascending colon: Secondary | ICD-10-CM | POA: Diagnosis not present

## 2016-08-04 DIAGNOSIS — K5730 Diverticulosis of large intestine without perforation or abscess without bleeding: Secondary | ICD-10-CM | POA: Diagnosis not present

## 2016-08-04 DIAGNOSIS — Z86010 Personal history of colonic polyps: Secondary | ICD-10-CM | POA: Diagnosis not present

## 2016-09-24 DIAGNOSIS — K219 Gastro-esophageal reflux disease without esophagitis: Secondary | ICD-10-CM | POA: Diagnosis not present

## 2016-09-24 DIAGNOSIS — I10 Essential (primary) hypertension: Secondary | ICD-10-CM | POA: Diagnosis not present

## 2016-09-24 DIAGNOSIS — E1121 Type 2 diabetes mellitus with diabetic nephropathy: Secondary | ICD-10-CM | POA: Diagnosis not present

## 2016-11-10 IMAGING — CT CT HEAD W/O CM
4 of 5 series · 16 of 47 positions shown, 17 images · non-contrast
Comparison: None.

CLINICAL DATA: Upper extremity numbness and generalized weakness
for 2 weeks. History of hypertension, diabetes.

EXAM:
CT HEAD WITHOUT CONTRAST
CT CERVICAL SPINE WITHOUT CONTRAST
TECHNIQUE: Multidetector CT imaging of the head and cervical spine was
performed following the standard protocol without intravenous
contrast. Multiplanar CT image reconstructions of the cervical spine
were also generated.

[Series 2: headseq 4.8 h37s · axial · 0.47mm/px · z∈[+197,+265]mm · 3 of 30 slices shown, 4 images]
[im 8/30  brain]
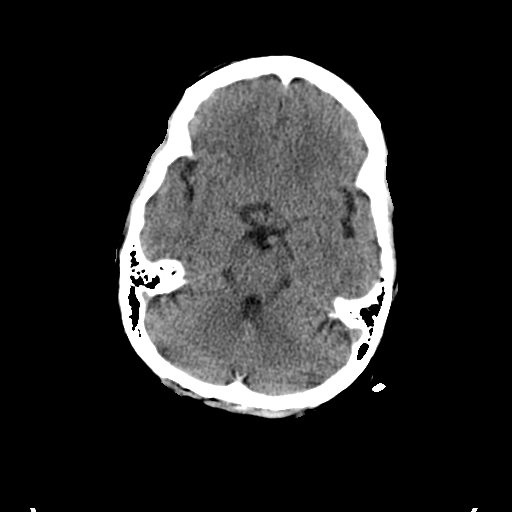
[im 8/30  bone]
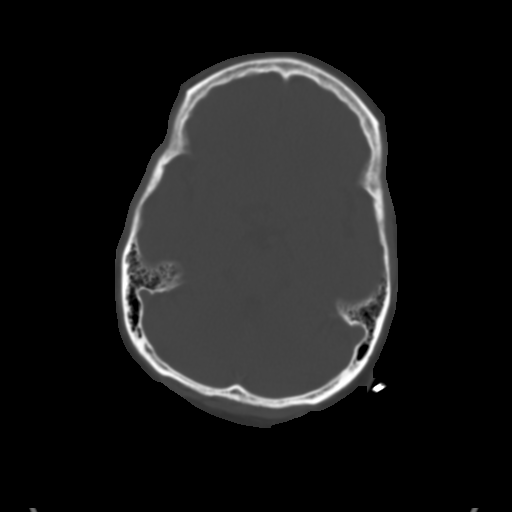
[im 15/30  brain]
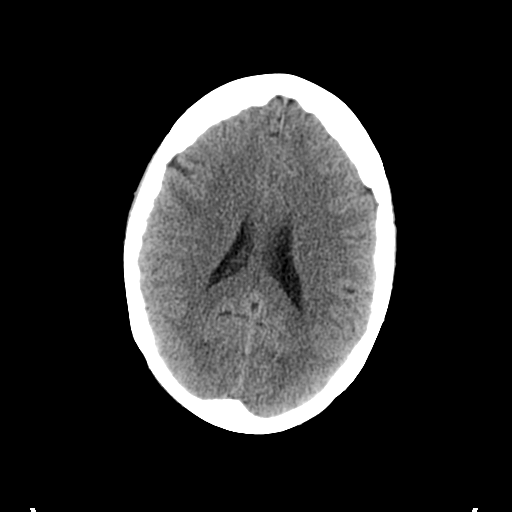
[im 22/30  brain]
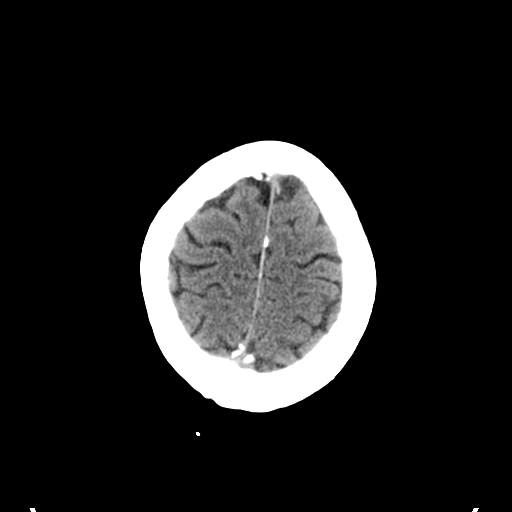

[Series 7: sagittal bone 2.0 · sagittal · 0.23mm/px · 3 of 60 slices shown]
[im 20/60  brain]
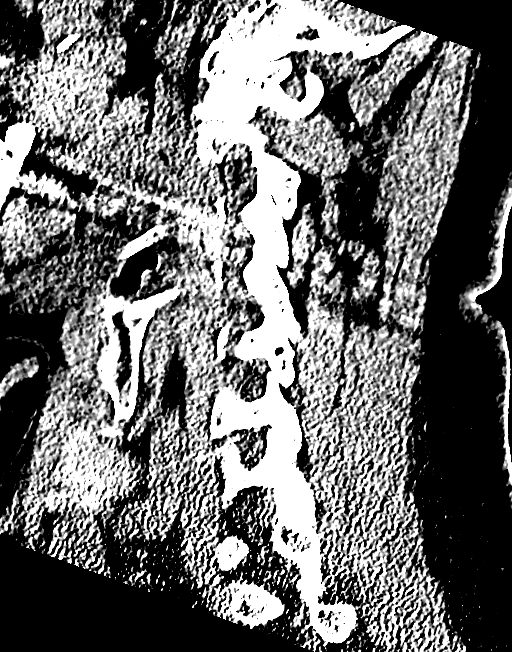
[im 30/60  brain]
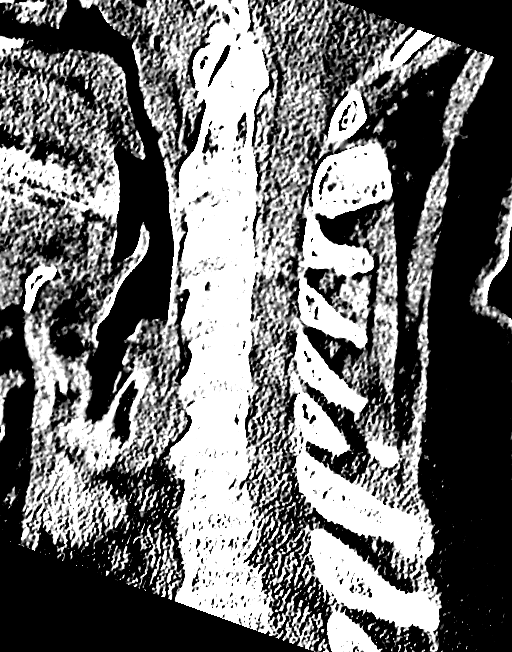
[im 40/60  brain]
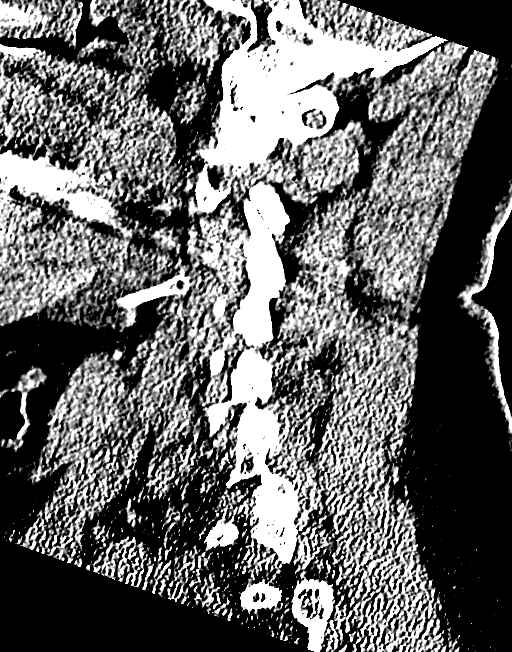

[Series 8: coronal bone 2.0 · coronal · 0.29mm/px · 3 of 52 slices shown]
[im 18/52  brain]
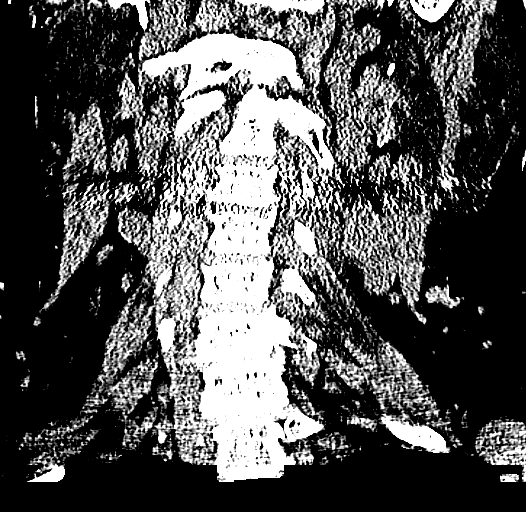
[im 23/52  brain]
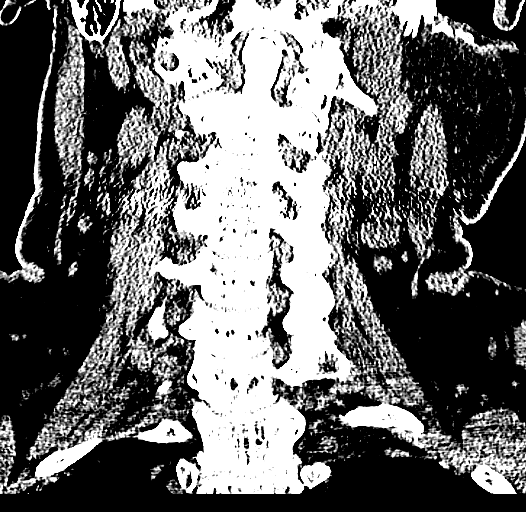
[im 29/52  brain]
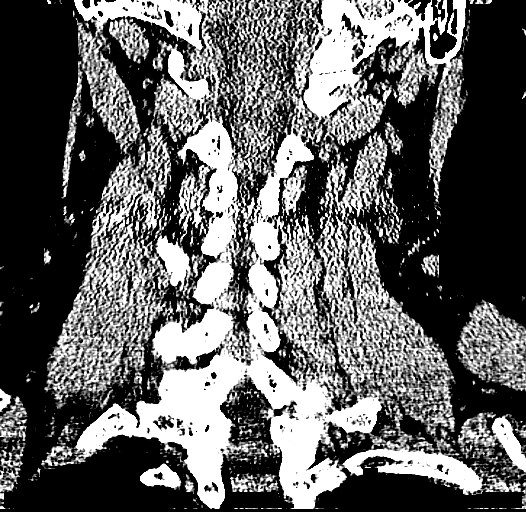

[Series 9: axial bone 2.0 · axial · 0.21mm/px · z∈[+8,+103]mm · 7 of 78 slices shown]
[im 7/78  bone]
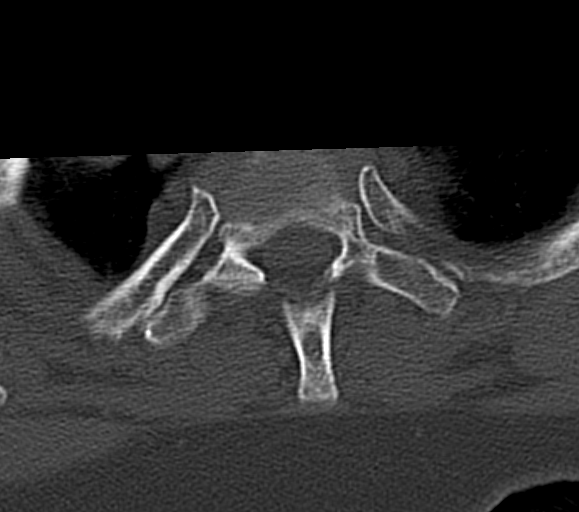
[im 20/78  bone]
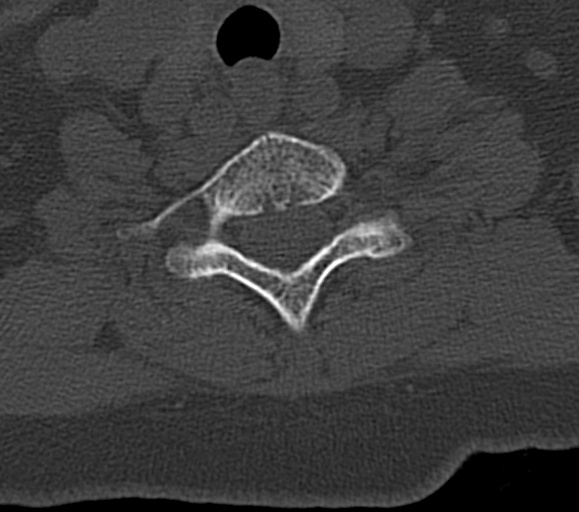
[im 26/78  bone]
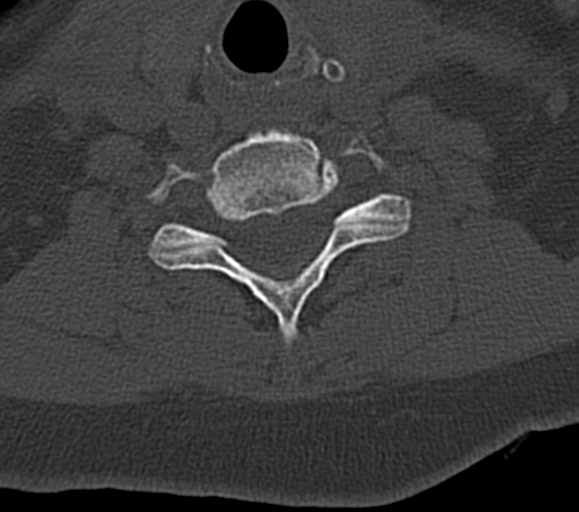
[im 33/78  bone]
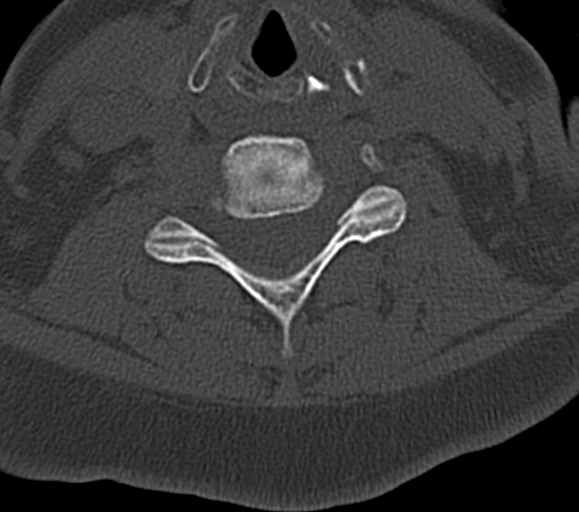
[im 45/78  bone]
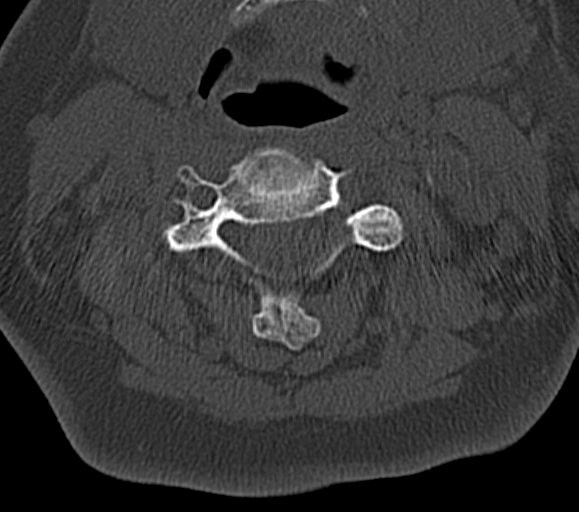
[im 52/78  bone]
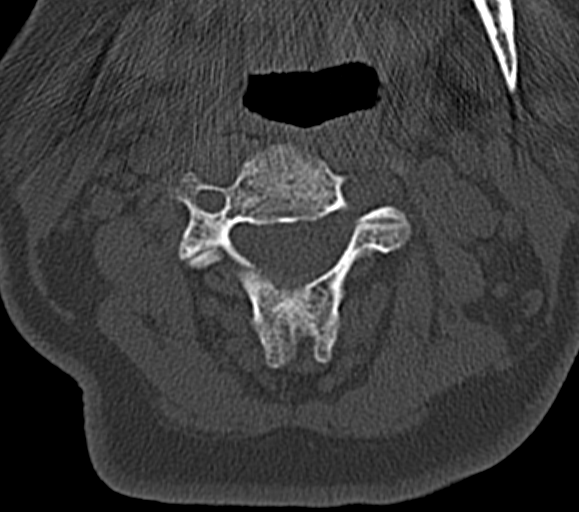
[im 58/78  bone]
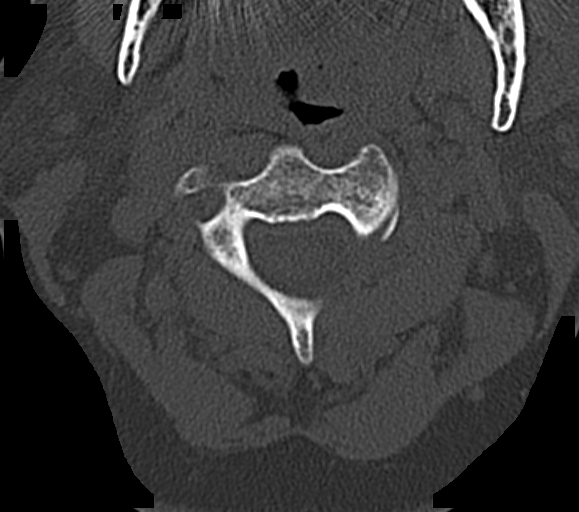

[16 of 47 positions shown; findings below may reference images not displayed]

FINDINGS: CT HEAD FINDINGS

The ventricles and sulci are normal for age. No intraparenchymal
hemorrhage, mass effect nor midline shift. No acute large vascular
territory infarcts.

No abnormal extra-axial fluid collections. Basal cisterns are
patent. Mild calcific atherosclerosis of the carotid siphons.

No skull fracture. The included ocular globes and orbital contents
are non-suspicious. The mastoid aircells and included paranasal
sinuses are well-aerated.

CT CERVICAL SPINE FINDINGS

Cervical vertebral bodies and posterior elements intact and aligned
with straightened cervical lordosis. Mild C6-7 disc height loss with
ventral endplate spurring. C1-2 articulation maintained with severe
arthropathy. No destructive bony lesions. Included prevertebral and
paraspinal soft tissues are nonsuspicious. Nuchal ligament
calcification and C7.

Mild C3-4 neural foraminal narrowing. Mild RIGHT C6-7 neural
foraminal narrowing. No osseous canal stenosis.
IMPRESSION: CT HEAD: Negative.

CT CERVICAL SPINE: Straightened cervical lordosis without acute
fracture or malalignment.

## 2016-12-30 DIAGNOSIS — E1121 Type 2 diabetes mellitus with diabetic nephropathy: Secondary | ICD-10-CM | POA: Diagnosis not present

## 2016-12-30 DIAGNOSIS — E784 Other hyperlipidemia: Secondary | ICD-10-CM | POA: Diagnosis not present

## 2016-12-30 DIAGNOSIS — I10 Essential (primary) hypertension: Secondary | ICD-10-CM | POA: Diagnosis not present

## 2017-01-11 DIAGNOSIS — G44219 Episodic tension-type headache, not intractable: Secondary | ICD-10-CM | POA: Diagnosis not present

## 2017-01-11 DIAGNOSIS — H40033 Anatomical narrow angle, bilateral: Secondary | ICD-10-CM | POA: Diagnosis not present

## 2019-04-11 ENCOUNTER — Other Ambulatory Visit (HOSPITAL_COMMUNITY): Payer: Self-pay | Admitting: Internal Medicine

## 2019-04-11 DIAGNOSIS — Z1231 Encounter for screening mammogram for malignant neoplasm of breast: Secondary | ICD-10-CM

## 2019-05-05 ENCOUNTER — Ambulatory Visit (HOSPITAL_COMMUNITY)
Admission: RE | Admit: 2019-05-05 | Discharge: 2019-05-05 | Disposition: A | Discharge status: Home / Self Care | Payer: Medicare Other | Source: Ambulatory Visit | Attending: Internal Medicine | Admitting: Internal Medicine

## 2019-05-05 ENCOUNTER — Other Ambulatory Visit: Payer: Self-pay

## 2019-05-05 DIAGNOSIS — Z1231 Encounter for screening mammogram for malignant neoplasm of breast: Secondary | ICD-10-CM | POA: Insufficient documentation

## 2019-06-02 DIAGNOSIS — Z299 Encounter for prophylactic measures, unspecified: Secondary | ICD-10-CM | POA: Diagnosis not present

## 2019-06-02 DIAGNOSIS — E1165 Type 2 diabetes mellitus with hyperglycemia: Secondary | ICD-10-CM | POA: Diagnosis not present

## 2019-06-02 DIAGNOSIS — E119 Type 2 diabetes mellitus without complications: Secondary | ICD-10-CM | POA: Diagnosis not present

## 2019-06-02 DIAGNOSIS — I739 Peripheral vascular disease, unspecified: Secondary | ICD-10-CM | POA: Diagnosis not present

## 2019-06-02 DIAGNOSIS — Z789 Other specified health status: Secondary | ICD-10-CM | POA: Diagnosis not present

## 2019-06-02 DIAGNOSIS — E1142 Type 2 diabetes mellitus with diabetic polyneuropathy: Secondary | ICD-10-CM | POA: Diagnosis not present

## 2019-06-02 DIAGNOSIS — I10 Essential (primary) hypertension: Secondary | ICD-10-CM | POA: Diagnosis not present

## 2019-06-13 DIAGNOSIS — I10 Essential (primary) hypertension: Secondary | ICD-10-CM | POA: Diagnosis not present

## 2019-07-16 DIAGNOSIS — I10 Essential (primary) hypertension: Secondary | ICD-10-CM | POA: Diagnosis not present

## 2019-07-28 DIAGNOSIS — Z299 Encounter for prophylactic measures, unspecified: Secondary | ICD-10-CM | POA: Diagnosis not present

## 2019-07-28 DIAGNOSIS — Z7189 Other specified counseling: Secondary | ICD-10-CM | POA: Diagnosis not present

## 2019-07-28 DIAGNOSIS — Z0000 Encounter for general adult medical examination without abnormal findings: Secondary | ICD-10-CM | POA: Diagnosis not present

## 2019-08-15 DIAGNOSIS — I10 Essential (primary) hypertension: Secondary | ICD-10-CM | POA: Diagnosis not present

## 2019-08-25 DIAGNOSIS — E119 Type 2 diabetes mellitus without complications: Secondary | ICD-10-CM | POA: Diagnosis not present

## 2019-08-25 DIAGNOSIS — I10 Essential (primary) hypertension: Secondary | ICD-10-CM | POA: Diagnosis not present

## 2019-08-25 DIAGNOSIS — R079 Chest pain, unspecified: Secondary | ICD-10-CM | POA: Diagnosis not present

## 2019-08-25 DIAGNOSIS — E7800 Pure hypercholesterolemia, unspecified: Secondary | ICD-10-CM | POA: Diagnosis not present

## 2019-08-25 DIAGNOSIS — E559 Vitamin D deficiency, unspecified: Secondary | ICD-10-CM | POA: Diagnosis not present

## 2019-08-25 DIAGNOSIS — R002 Palpitations: Secondary | ICD-10-CM | POA: Diagnosis not present

## 2019-09-08 DIAGNOSIS — E7800 Pure hypercholesterolemia, unspecified: Secondary | ICD-10-CM | POA: Diagnosis not present

## 2019-09-08 DIAGNOSIS — E119 Type 2 diabetes mellitus without complications: Secondary | ICD-10-CM | POA: Diagnosis not present

## 2019-09-08 DIAGNOSIS — R82998 Other abnormal findings in urine: Secondary | ICD-10-CM | POA: Diagnosis not present

## 2019-09-08 DIAGNOSIS — E559 Vitamin D deficiency, unspecified: Secondary | ICD-10-CM | POA: Diagnosis not present

## 2019-09-08 DIAGNOSIS — I10 Essential (primary) hypertension: Secondary | ICD-10-CM | POA: Diagnosis not present

## 2019-09-08 DIAGNOSIS — N390 Urinary tract infection, site not specified: Secondary | ICD-10-CM | POA: Diagnosis not present

## 2019-09-15 DIAGNOSIS — I10 Essential (primary) hypertension: Secondary | ICD-10-CM | POA: Diagnosis not present

## 2019-12-08 DIAGNOSIS — R82998 Other abnormal findings in urine: Secondary | ICD-10-CM | POA: Diagnosis not present

## 2019-12-08 DIAGNOSIS — E119 Type 2 diabetes mellitus without complications: Secondary | ICD-10-CM | POA: Diagnosis not present

## 2019-12-08 DIAGNOSIS — E7800 Pure hypercholesterolemia, unspecified: Secondary | ICD-10-CM | POA: Diagnosis not present

## 2019-12-08 DIAGNOSIS — E559 Vitamin D deficiency, unspecified: Secondary | ICD-10-CM | POA: Diagnosis not present

## 2019-12-08 DIAGNOSIS — I10 Essential (primary) hypertension: Secondary | ICD-10-CM | POA: Diagnosis not present

## 2019-12-29 DIAGNOSIS — M79605 Pain in left leg: Secondary | ICD-10-CM | POA: Diagnosis not present

## 2019-12-29 DIAGNOSIS — M79662 Pain in left lower leg: Secondary | ICD-10-CM | POA: Diagnosis not present

## 2019-12-29 DIAGNOSIS — I10 Essential (primary) hypertension: Secondary | ICD-10-CM | POA: Diagnosis not present

## 2019-12-29 DIAGNOSIS — M79604 Pain in right leg: Secondary | ICD-10-CM | POA: Diagnosis not present

## 2019-12-29 DIAGNOSIS — E119 Type 2 diabetes mellitus without complications: Secondary | ICD-10-CM | POA: Diagnosis not present

## 2019-12-29 DIAGNOSIS — R600 Localized edema: Secondary | ICD-10-CM | POA: Diagnosis not present

## 2019-12-29 DIAGNOSIS — M7989 Other specified soft tissue disorders: Secondary | ICD-10-CM | POA: Diagnosis not present

## 2019-12-29 DIAGNOSIS — I499 Cardiac arrhythmia, unspecified: Secondary | ICD-10-CM | POA: Diagnosis not present

## 2020-01-05 DIAGNOSIS — M2550 Pain in unspecified joint: Secondary | ICD-10-CM | POA: Diagnosis not present

## 2020-01-05 DIAGNOSIS — M79606 Pain in leg, unspecified: Secondary | ICD-10-CM | POA: Diagnosis not present

## 2020-01-05 DIAGNOSIS — M19041 Primary osteoarthritis, right hand: Secondary | ICD-10-CM | POA: Diagnosis not present

## 2020-01-05 DIAGNOSIS — M79671 Pain in right foot: Secondary | ICD-10-CM | POA: Diagnosis not present

## 2020-01-05 DIAGNOSIS — M19042 Primary osteoarthritis, left hand: Secondary | ICD-10-CM | POA: Diagnosis not present

## 2020-01-05 DIAGNOSIS — M79672 Pain in left foot: Secondary | ICD-10-CM | POA: Diagnosis not present

## 2020-01-05 DIAGNOSIS — M79641 Pain in right hand: Secondary | ICD-10-CM | POA: Diagnosis not present

## 2020-01-05 DIAGNOSIS — M791 Myalgia, unspecified site: Secondary | ICD-10-CM | POA: Diagnosis not present

## 2020-01-05 DIAGNOSIS — M7989 Other specified soft tissue disorders: Secondary | ICD-10-CM | POA: Diagnosis not present

## 2020-01-05 DIAGNOSIS — M79642 Pain in left hand: Secondary | ICD-10-CM | POA: Diagnosis not present

## 2020-01-05 DIAGNOSIS — M19071 Primary osteoarthritis, right ankle and foot: Secondary | ICD-10-CM | POA: Diagnosis not present

## 2020-01-05 DIAGNOSIS — R748 Abnormal levels of other serum enzymes: Secondary | ICD-10-CM | POA: Diagnosis not present

## 2020-01-05 DIAGNOSIS — M19072 Primary osteoarthritis, left ankle and foot: Secondary | ICD-10-CM | POA: Diagnosis not present

## 2020-01-05 DIAGNOSIS — M8589 Other specified disorders of bone density and structure, multiple sites: Secondary | ICD-10-CM | POA: Diagnosis not present

## 2020-01-09 DIAGNOSIS — D8989 Other specified disorders involving the immune mechanism, not elsewhere classified: Secondary | ICD-10-CM | POA: Diagnosis not present

## 2020-01-09 DIAGNOSIS — R748 Abnormal levels of other serum enzymes: Secondary | ICD-10-CM | POA: Diagnosis not present

## 2020-01-09 DIAGNOSIS — M2550 Pain in unspecified joint: Secondary | ICD-10-CM | POA: Diagnosis not present

## 2020-01-09 DIAGNOSIS — Z79899 Other long term (current) drug therapy: Secondary | ICD-10-CM | POA: Diagnosis not present

## 2020-01-09 DIAGNOSIS — G629 Polyneuropathy, unspecified: Secondary | ICD-10-CM | POA: Diagnosis not present

## 2020-01-09 DIAGNOSIS — M79606 Pain in leg, unspecified: Secondary | ICD-10-CM | POA: Diagnosis not present

## 2020-01-09 DIAGNOSIS — M7989 Other specified soft tissue disorders: Secondary | ICD-10-CM | POA: Diagnosis not present

## 2020-01-23 ENCOUNTER — Other Ambulatory Visit: Payer: Self-pay

## 2020-01-23 ENCOUNTER — Encounter: Payer: Self-pay | Admitting: Neurology

## 2020-01-23 ENCOUNTER — Ambulatory Visit: Payer: Medicare Other | Admitting: Neurology

## 2020-01-23 VITALS — BP 132/83 | HR 79 | Ht 64.0 in | Wt 179.5 lb

## 2020-01-23 DIAGNOSIS — R52 Pain, unspecified: Secondary | ICD-10-CM | POA: Diagnosis not present

## 2020-01-23 DIAGNOSIS — R202 Paresthesia of skin: Secondary | ICD-10-CM

## 2020-01-23 MED ORDER — DULOXETINE HCL 30 MG PO CPEP: 30.0000 mg | ORAL_CAPSULE | Freq: Every day | ORAL | 11 refills | Status: DC

## 2020-01-23 MED ORDER — GABAPENTIN 100 MG PO CAPS: 100.0000 mg | ORAL_CAPSULE | Freq: Three times a day (TID) | ORAL | 11 refills | Status: DC

## 2020-01-23 NOTE — Progress Notes (Signed)
Chief Complaint  Patient presents with   New Patient (Initial Visit)    She is here with her sister, Stanton Kidney. Referred for evaluation of NCV/EMG. Reports burning, pain and swelling in her bilateral lower extremities.    Rheumatology    Lahoma Rocker, MD - referring provider   PCP    [name], DO    HISTORICAL  Sabrina Suarez is a 74 year old female, seen in request by rheumatologist Dr. Lahoma Rocker for evaluation of bilateral feet paresthesia, diffuse body achy pain, her primary care physician is Dr. Theda Sers, [name], initial evaluation was on January 23, 2020  I reviewed and summarized the referring note.  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 or pyoderma gangrenosum2008, she was treated with a large dose of prednisone for 3 to 4 years, patient recalled the dosage was up to 80 mg daily, during that period of time, she has developed diabetes, shortly afterwards she also developed diffuse body achy pain, mainly at bilateral lower extremity, she described burning ankle, pain to the bone, especially when bearing weight, but she denies bilateral plantar surface and toes paresthesia,  Over the years, her sticking condition has much improved, did require wound care in the past, had large size scars, but she was tapered off steroid treatment, her diabetes has improved as well  But she continue to doing a diffuse body achy pain, bilateral her ankle pain, she described burning sensation, swelling, especially after bearing weight, the bone can deep to the bone, she has been taking low-dose gabapentin 100 mg every night, has been helpful, sometimes over-the-counter cream was  helpful  Laboratory evaluation seen August 2021, mild elevated C-reactive protein 12, CPK 358, rheumatoid factor within normal limit 10.9, ESR of 22, CCP antibody was negative, ANA was negative   REVIEW OF SYSTEMS: Full 14 system review of systems performed and notable only for as above All other review of systems were negative.  ALLERGIES: Allergies  Allergen Reactions   Benadryl [Diphenhydramine Hcl (Sleep)] Nausea And Vomiting   Sudafed [Pseudoephedrine Hcl]     HOME MEDICATIONS: Current Outpatient Medications  Medication Sig Dispense Refill   esomeprazole (NEXIUM) 40 MG capsule Take 40 mg by mouth daily at 12 noon.     gabapentin (NEURONTIN) 100 MG capsule Take 100 mg by mouth at bedtime.     hydrALAZINE (APRESOLINE) 50 MG tablet Take 50 mg by mouth 2 (two) times daily.     losartan-hydrochlorothiazide (HYZAAR) 100-25 MG tablet Take 1 tablet by mouth daily.     potassium chloride SA (K-DUR,KLOR-CON) 20 MEQ tablet Take 1 tablet (20 mEq total) by mouth daily. 30 tablet 3   rosuvastatin (CRESTOR) 20 MG tablet Take 20 mg by mouth daily.     No current facility-administered medications for this visit.    PAST MEDICAL HISTORY: Past Medical History:  Diagnosis Date   Diabetes mellitus without complication (HCC)    GERD (gastroesophageal reflux disease)    Hyperlipemia    Hypertension    Irregular heart beats    Pain in both lower legs    Peripheral neuropathy    Vitamin D deficiency     PAST SURGICAL HISTORY: Past Surgical History:  Procedure Laterality Date   ABDOMINAL HYSTERECTOMY     bladder  tack     COLONOSCOPY      FAMILY HISTORY: Family History  Problem Relation Age of Onset   Aneurysm Mother        abdominal   Heart attack Father     SOCIAL HISTORY: Social History   Socioeconomic History   Marital status: Widowed    Spouse name: Not on file   Number of children: 2   Years of education: GED   Highest education level: Not on  file  Occupational History   Occupation: Retired  Tobacco Use   Smoking status: Never Smoker   Smokeless tobacco: Never Used  Substance and Sexual Activity   Alcohol use: No   Drug use: No   Sexual activity: Not on file  Other Topics Concern   Not on file  Social History Narrative   Lives at home with her daughter.   Right-handed.   Caffeine use: 1 cup per day.   Social Determinants of Health   Financial Resource Strain:    Difficulty of Paying Living Expenses: Not on file  Food Insecurity:    Worried About Charity fundraiser in the Last Year: Not on file   [employer] of Food in the Last Year: Not on file  Transportation Needs:    Lack of Transportation (Medical): Not on file   Lack of Transportation (Non-Medical): Not on file  Physical Activity:    Days of Exercise per Week: Not on file   Minutes of Exercise per Session: Not on file  Stress:    Feeling of Stress : Not on file  Social Connections:    Frequency of Communication with Friends and Family: Not on file   Frequency of Social Gatherings with Friends and Family: Not on file   Attends Religious Services: Not on file   Active Member of Clubs or Organizations: Not on file   Attends Archivist Meetings: Not on file   Marital Status: Not on file  Intimate Partner Violence:    Fear of Current or Ex-Partner: Not on file   Emotionally Abused: Not on file   Physically Abused: Not on file   Sexually Abused: Not on file     PHYSICAL EXAM   Vitals:   01/23/20 0925  BP: 132/83  Pulse: 79  Weight: 179 lb 8 oz (81.4 kg)  Height: _0  (1.626 m)   Not recorded     Body mass index is 30.81 kg/m.  PHYSICAL EXAMNIATION:  Gen: NAD, conversant, well nourised, well groomed                     Cardiovascular: Regular rate rhythm, no peripheral edema, warm, nontender. Eyes: Conjunctivae clear without exudates or hemorrhage Neck: Supple, no carotid bruits. Pulmonary: Clear to  auscultation bilaterally   NEUROLOGICAL EXAM:  MENTAL STATUS: Speech:    Speech is normal; fluent and spontaneous with normal comprehension.  Cognition:     Orientation to time, place and person     Normal recent and remote memory     Normal Attention span and concentration     Normal Language, naming, repeating,spontaneous speech     Fund of knowledge   CRANIAL NERVES: CN II: Visual fields are full to confrontation. Pupils are round equal and briskly reactive to light. CN III, IV, VI: extraocular movement are normal. No ptosis. CN V: Facial sensation is intact to light touch CN VII: Face is symmetric with normal eye closure  CN VIII: Hearing is normal to  causal conversation. CN IX, X: Phonation is normal. CN XI: Head turning and shoulder shrug are intact  MOTOR: Normal muscle tone, bulk, strength, but significant body achy pain upon deep palpitation," pain in the bone", healed bilateral lower extremity large area skin scar  REFLEXES: Reflexes are 2+ and symmetric at the biceps, triceps, knees, and ankles. Plantar responses are flexor.  SENSORY: Intact to light touch, pinprick and vibratory sensation are intact in fingers and toes.  COORDINATION: There is no trunk or limb dysmetria noted.  GAIT/STANCE: Need to push up to get up from seated position, antalgic,  DIAGNOSTIC DATA (LABS, IMAGING, TESTING) - I reviewed patient records, labs, notes, testing and imaging myself where available.   ASSESSMENT AND PLAN  FARRIE SANN is a 74 y.o. female   History of autoimmune disease, was treated with high-dose of steroid from 2008-2011, Gradual onset diffuse body achy pain, bilateral lower extremity paresthesia  Today's examination showed normal strength, sensory examination, but she has significant tenderness upon deep palpitation  Differentiation diagnosis include polymyalgia rheumatica, need to rule out peripheral neuropathy, intrinsic muscle disease  Laboratory  evaluations, including inflammatory markers, CPK level  EMG nerve conduction study  She responding well, but only maintained on very low-dose gabapentin 100 mg every night, will increase to 3 times a day, add on Cymbalta 30 mg daily  Marcial Pacas, M.D. Ph.D.  [facility] [address] [place], [place] [address] Ph: [phone] Fax: [phone]  CC:  Lahoma Rocker, MD [address] [place],   [address]  [name], DO

## 2020-01-24 LAB — PAN-ANCA
ANCA Proteinase 3: <3.5 U/mL (ref 0.0–3.5)
Atypical pANCA: <1:20 {titer}
C-ANCA: <1:20 {titer}
Myeloperoxidase Ab: <9 U/mL (ref 0.0–9.0)
P-ANCA: <1:20 {titer}

## 2020-01-24 LAB — ANA W/REFLEX IF POSITIVE: Anti Nuclear Antibody (ANA): NEGATIVE

## 2020-01-24 LAB — VITAMIN B12: Vitamin B-12: 585 pg/mL (ref 232–1245)

## 2020-01-24 LAB — C-REACTIVE PROTEIN: CRP: 14 mg/L — ABNORMAL HIGH (ref 0–10)

## 2020-01-24 LAB — CBC WITH DIFFERENTIAL
Basophils Absolute: 0 10*3/uL (ref 0.0–0.2)
Basos: 1 %
EOS (ABSOLUTE): 0 10*3/uL (ref 0.0–0.4)
Eos: 1 %
Hematocrit: 38.1 % (ref 34.0–46.6)
Hemoglobin: 12 g/dL (ref 11.1–15.9)
Immature Grans (Abs): 0 10*3/uL (ref 0.0–0.1)
Immature Granulocytes: 0 %
Lymphocytes Absolute: 1.7 10*3/uL (ref 0.7–3.1)
Lymphs: 32 %
MCH: 25.8 pg — ABNORMAL LOW (ref 26.6–33.0)
MCHC: 31.5 g/dL (ref 31.5–35.7)
MCV: 82 fL (ref 79–97)
Monocytes Absolute: 0.4 10*3/uL (ref 0.1–0.9)
Monocytes: 7 %
Neutrophils Absolute: 3.2 10*3/uL (ref 1.4–7.0)
Neutrophils: 59 %
RBC: 4.66 x10E6/uL (ref 3.77–5.28)
RDW: 15.2 % (ref 11.7–15.4)
WBC: 5.3 10*3/uL (ref 3.4–10.8)

## 2020-01-24 LAB — COMPREHENSIVE METABOLIC PANEL
ALT: 8 IU/L (ref 0–32)
AST: 12 IU/L (ref 0–40)
Albumin/Globulin Ratio: 1.5 (ref 1.2–2.2)
Albumin: 4.6 g/dL (ref 3.7–4.7)
Alkaline Phosphatase: 65 IU/L (ref 48–121)
BUN/Creatinine Ratio: 15 (ref 12–28)
BUN: 16 mg/dL (ref 8–27)
Bilirubin Total: 0.2 mg/dL (ref 0.0–1.2)
CO2: 24 mmol/L (ref 20–29)
Calcium: 9.5 mg/dL (ref 8.7–10.3)
Chloride: 102 mmol/L (ref 96–106)
Creatinine, Ser: 1.07 mg/dL — ABNORMAL HIGH (ref 0.57–1.00)
GFR calc Af Amer: 59 mL/min/{1.73_m2} — ABNORMAL LOW (ref >59)
GFR calc non Af Amer: 51 mL/min/{1.73_m2} — ABNORMAL LOW (ref >59)
Globulin, Total: 3.1 g/dL (ref 1.5–4.5)
Glucose: 102 mg/dL — ABNORMAL HIGH (ref 65–99)
Potassium: 4.2 mmol/L (ref 3.5–5.2)
Sodium: 142 mmol/L (ref 134–144)
Total Protein: 7.7 g/dL (ref 6.0–8.5)

## 2020-01-24 LAB — TSH: TSH: 1.57 u[IU]/mL (ref 0.450–4.500)

## 2020-01-24 LAB — SEDIMENTATION RATE: Sed Rate: 26 mm/hr (ref 0–40)

## 2020-01-24 LAB — HIV ANTIBODY (ROUTINE TESTING W REFLEX): HIV Screen 4th Generation wRfx: NONREACTIVE

## 2020-01-24 LAB — CK: Total CK: 201 U/L — ABNORMAL HIGH (ref 32–182)

## 2020-01-24 LAB — FOLATE: Folate: 7.9 ng/mL (ref >3.0)

## 2020-01-24 LAB — RPR: RPR Ser Ql: NONREACTIVE

## 2020-01-25 ENCOUNTER — Telehealth: Payer: Self-pay | Admitting: Neurology

## 2020-01-25 NOTE — Telephone Encounter (Signed)
Please call patient, laboratory evaluation showed mild elevated creatinine 1.07, GFR of 59, she should increase water intake  Rest of the laboratory evaluation showed no significant abnormalities.  I have forwarded results to her primary care physician Dr.Collins, [name], DO

## 2020-01-25 NOTE — Telephone Encounter (Signed)
Left detailed message with results on voicemail. Provided instructions to increase water intake and follow up with PCP concerning the lab findings. I left our number to call back with any questions.

## 2020-01-29 ENCOUNTER — Ambulatory Visit (INDEPENDENT_AMBULATORY_CARE_PROVIDER_SITE_OTHER): Payer: Medicare Other | Admitting: Neurology

## 2020-01-29 ENCOUNTER — Ambulatory Visit: Payer: Medicare Other | Admitting: Neurology

## 2020-01-29 ENCOUNTER — Other Ambulatory Visit: Payer: Self-pay

## 2020-01-29 DIAGNOSIS — R52 Pain, unspecified: Secondary | ICD-10-CM

## 2020-01-29 DIAGNOSIS — R202 Paresthesia of skin: Secondary | ICD-10-CM

## 2020-01-29 NOTE — Procedures (Signed)
Full Name: Sabrina Suarez Gender: Female MRN #: [id number] Date of Birth: 23-Jun-1945    Visit Date: 01/29/2020 09:59 Age: 74 Years Examining Physician: Marcial Pacas, MD  Referring Physician: Marcial Pacas, MD; Lahoma Rocker, MD Height: 5 feet 4 inch History: 74 year old female, with diffuse body achy pain, burning paresthesia  Summary of the test: Nerve conduction study: Bilateral sural, superficial peroneal sensory responses were normal.  Bilateral peroneal to EDB tibial motor responses were normal.  Right ulnar sensory and motor responses were normal  Electromyography:  Selected needle examination of the right lower extremity muscles, right lumbosacral paraspinal muscles, right upper extremity muscles were normal.  Conclusion: This is a normal study.  There is no electrodiagnostic evidence of large fiber peripheral neuropathy or right lumbosacral radiculopathy.    ------------------------------- Marcial Pacas, M.D. PhD  [facility] [address], [place] [address] Tel: [phone] Fax: [phone]  Verbal informed consent was obtained from the patient, patient was informed of potential risk of procedure, including bruising, bleeding, hematoma formation, infection, muscle weakness, muscle pain, numbness, among others.         Sabrina Suarez    Nerve / Sites Muscle Latency Ref. Amplitude Ref. Rel Amp Segments Distance Velocity Ref. Area    ms ms mV mV %  cm m/s m/s mVms  R Ulnar - ADM     Wrist ADM 2.7 ?3.3 6.9 ?6.0 100 Wrist - ADM 7   24.8     B.Elbow ADM 6.0  6.3  91.3 B.Elbow - Wrist 20 61 ?49 24.1     A.Elbow ADM 7.8  6.5  104 A.Elbow - B.Elbow 10 56 ?49 23.1         A.Elbow - Wrist      R Peroneal - EDB     Ankle EDB 4.0 ?6.5 4.4 ?2.0 100 Ankle - EDB 9   11.7     Fib head EDB 9.3  4.0  91.3 Fib head - Ankle 27 50 ?44 10.9     Pop fossa EDB 11.2  3.9  96.6 Pop fossa - Fib head 10 52 ?44 11.2         Pop fossa - Ankle      L Peroneal - EDB     Ankle  EDB 3.9 ?6.5 4.5 ?2.0 100 Ankle - EDB 9   13.2     Fib head EDB 9.6  3.3  73.3 Fib head - Ankle 26 46 ?44 10.5     Pop fossa EDB 11.8  2.7  81.2 Pop fossa - Fib head 10 46 ?44 8.6         Pop fossa - Ankle      R Tibial - AH     Ankle AH 5.3 ?5.8 4.7 ?4.0 100 Ankle - AH 9   8.7     Pop fossa AH 13.1  0.5  11.1 Pop fossa - Ankle 36 46 ?41 2.7  L Tibial - AH     Ankle AH 5.5 ?5.8 9.5 ?4.0 100 Ankle - AH 9   17.8     Pop fossa AH 13.5  5.4  56.7 Pop fossa - Ankle 33 41 ?41 19.0                SNC    Nerve / Sites Rec. Site Peak Lat Ref.  Amp Ref. Segments Distance    ms ms V V  cm  R Sural - Ankle (Calf)  Calf Ankle 3.5 ?4.4 10 ?6 Calf - Ankle 14  L Sural - Ankle (Calf)     Calf Ankle 4.1 ?4.4 10 ?6 Calf - Ankle 14  R Superficial peroneal - Ankle     Lat leg Ankle 3.4 ?4.4 6 ?6 Lat leg - Ankle 14  L Superficial peroneal - Ankle     Lat leg Ankle 3.8 ?4.4 10 ?6 Lat leg - Ankle 14  R Ulnar - Orthodromic, (Dig V, Mid palm)     Dig V Wrist 2.8 ?3.1 7 ?5 Dig V - Wrist 46               F  Wave    Nerve F Lat Ref.   ms ms  R Tibial - AH 46.1 ?56.0  L Tibial - AH 52.5 ?56.0  R Ulnar - ADM 28.6 ?32.0           EMG Summary Table    Spontaneous MUAP Recruitment  Muscle IA Fib PSW Fasc Other Amp Dur. Poly Pattern  R. Tibialis anterior Normal None None None _______ Normal Normal Normal Normal  R. Tibialis posterior Normal None None None _______ Normal Normal Normal Normal  R. Gastrocnemius (Medial head) Normal None None None _______ Normal Normal Normal Normal  R. Peroneus longus Normal None None None _______ Normal Normal Normal Normal  R. Vastus lateralis Normal None None None _______ Normal Normal Normal Normal  R. Triceps brachii Normal None None None _______ Normal Normal Normal Normal  R. Deltoid Normal None None None _______ Normal Normal Normal Normal  R. Biceps brachii Normal None None None _______ Normal Normal Normal Normal  R. Lumbar paraspinals (mid) Normal None None  None _______ Normal Normal Normal Normal  R. Lumbar paraspinals (low) Normal None None None _______ Normal Normal Normal Normal

## 2020-01-29 NOTE — Progress Notes (Signed)
No chief complaint on file.   HISTORICAL  Sabrina Suarez is a 74 year old female, seen in request by rheumatologist Dr. Lahoma Rocker for evaluation of bilateral feet paresthesia, diffuse body achy pain, her primary care physician is Dr. Theda Sers, [name], initial evaluation was on January 23, 2020  I reviewed and summarized the referring note.  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 or pyoderma gangrenosum2008, she was treated with a large dose of prednisone for 3 to 4 years, patient recalled the dosage was up to 80 mg daily, during that period of time, she has developed diabetes, shortly afterwards she also developed diffuse body achy pain, mainly at bilateral lower extremity, she described burning ankle, pain to the bone, especially when bearing weight, but she denies bilateral plantar surface and toes paresthesia,  Over the years, her sticking condition has much improved, did require wound care in the past, had large size scars, but she was tapered off steroid treatment, her diabetes has improved as well  But she continue to doing a diffuse body achy pain, bilateral her ankle pain, she described burning sensation, swelling, especially after bearing weight, the bone can deep to the bone, she has been taking low-dose gabapentin 100 mg every night, has been helpful, sometimes over-the-counter cream was helpful  Laboratory evaluation seen August 2021, mild elevated C-reactive protein 12, CPK 358, rheumatoid factor within normal limit 10.9, ESR of 22, CCP antibody was negative, ANA was negative  Update January 29, 2020: Laboratory evaluation showed normal or negative ANCA, ANA, ESR,  HIV, Y70, RPR, folic acid, TSH,  C-reactive protein was mildly elevated 14, ESR was normal 26, CPK was slightly elevated 201  Since Cymbalta 30 mg daily in September 2021, her symptoms has much improved, reported 70% improvement, but she complains of GI side effect, burning, nauseous sensation, she is already taking gabapentin 300 mg every night, only sleeps 3 to 4 hours, wake up hurting,  EMG nerve conduction study today is normal REVIEW OF SYSTEMS: Full 14 system review of systems performed and notable only for as above All other review of systems were negative.  ALLERGIES: Allergies  Allergen Reactions  . Benadryl [Diphenhydramine Hcl (Sleep)] Nausea And Vomiting  . Sudafed [Pseudoephedrine Hcl]     HOME MEDICATIONS: Current Outpatient Medications  Medication Sig Dispense Refill  . DULoxetine (CYMBALTA) 30 MG capsule Take 1 capsule (30 mg total) by mouth daily. 30 capsule 11  . esomeprazole (NEXIUM) 40 MG capsule Take 40 mg by mouth daily at 12 noon.    . gabapentin (NEURONTIN) 100 MG capsule Take 100 mg by mouth at bedtime.    . gabapentin (NEURONTIN) 100 MG capsule Take 1 capsule (100 mg total) by mouth 3 (three) times daily. 90 capsule 11  . hydrALAZINE (APRESOLINE) 50 MG tablet Take 50 mg by mouth 2 (two) times daily.    Marland Kitchen losartan-hydrochlorothiazide (HYZAAR) 100-25 MG tablet Take 1 tablet by mouth daily.    . potassium chloride SA (K-DUR,KLOR-CON) 20 MEQ tablet Take 1 tablet (20 mEq total) by mouth daily. 30 tablet 3  . rosuvastatin (CRESTOR) 20 MG tablet Take 20 mg by mouth daily.     No current facility-administered medications for this visit.    PAST MEDICAL HISTORY: Past Medical History:  Diagnosis Date  . Diabetes mellitus without complication ([place])   . GERD (gastroesophageal reflux disease)   . Hyperlipemia   . Hypertension   . Irregular heart beats   . Pain in both lower legs   . Peripheral neuropathy   . Vitamin D deficiency     PAST SURGICAL  HISTORY: Past Surgical History:  Procedure Laterality Date  . ABDOMINAL HYSTERECTOMY    . bladder tack    . COLONOSCOPY      FAMILY HISTORY: Family History  Problem Relation Age of Onset  . Aneurysm Mother        abdominal  . Heart attack Father     SOCIAL HISTORY: Social History   Socioeconomic History  . Marital status: Widowed    Spouse name: Not on file  . Number of children: 2  . Years of education: GED  . Highest education level: Not on file  Occupational History  . Occupation: Retired  Tobacco Use  . Smoking status: Never Smoker  . Smokeless tobacco: Never Used  Substance and Sexual Activity  . Alcohol use: No  . Drug use: No  . Sexual activity: Not on file  Other Topics Concern  . Not on file  Social History Narrative   Lives at home with her daughter.   Right-handed.   Caffeine use: 1 cup per day.   Social Determinants of Health   Financial Resource Strain:   . Difficulty of Paying Living Expenses: Not on file  Food Insecurity:   . Worried About Charity fundraiser in the Last Year: Not on file  . Ran Out of Food in the Last Year: Not on file  Transportation Needs:   . Lack of Transportation (Medical): Not on file  . Lack of Transportation (Non-Medical): Not on file  Physical Activity:   . Days of Exercise per Week: Not on file  . Minutes of Exercise per Session: Not on file  Stress:   . Feeling of Stress : Not on file  Social Connections:   . Frequency of Communication with Friends and Family: Not on file  . Frequency of Social Gatherings with Friends and Family: Not on file  . Attends Religious Services: Not on file  . Active Member of Clubs or Organizations: Not on file  . Attends Archivist Meetings: Not on file  . Marital Status: Not on file  Intimate Partner Violence:   . Fear of Current or Ex-Partner: Not on file  . Emotionally Abused: Not on file  . Physically Abused: Not on file  . Sexually Abused: Not on file      PHYSICAL EXAM   There were no vitals filed for this visit. Not recorded     There is no height or weight on file to calculate BMI.  PHYSICAL EXAMNIATION:  Gen: NAD, conversant, well nourised, well groomed         NEUROLOGICAL EXAM:  MENTAL STATUS: Speech/cognition: Awake, alert oriented to history taking care of conversation CRANIAL NERVES: CN II: Visual fields are full to confrontation. Pupils are round equal and briskly reactive to light. CN III, IV, VI: extraocular movement are normal. No ptosis. CN V: Facial sensation is intact to light touch CN VII: Face is symmetric with normal eye closure  CN VIII: Hearing is normal to causal conversation. CN IX, X: Phonation is normal. CN XI: Head turning and shoulder shrug are intact  MOTOR: Normal muscle tone, bulk, strength,  REFLEXES: Reflexes are 1 and symmetric at the  biceps, triceps, knees, and ankles. Plantar responses are flexor.  SENSORY: Intact to light touch, pinprick and vibratory sensation are intact in fingers and toes.  COORDINATION: There is no trunk or limb dysmetria noted.  GAIT/STANCE: Need to push up to get up from seated position, antalgic,  DIAGNOSTIC DATA (LABS, IMAGING, TESTING) - I reviewed patient records, labs, notes, testing and imaging myself where available.   ASSESSMENT AND PLAN  RAIMA GEATHERS is a 74 y.o. female   History of autoimmune disease, was treated with high-dose of steroid from 2008-2011, Gradual onset diffuse body achy pain, bilateral lower extremity paresthesia  Normal EMG nerve conduction study, no evidence of large fiber peripheral neuropathy, or intrinsic muscle disease,  Laboratory evaluation showed no significant abnormality,  Symptoms improved with Cymbalta 30 mg daily, but complains of GI side effect, she also responded well to gabapentin, currently taking 300 mg every night,  I have advised her be consistent with Cymbalta, may consider higher dose if she passed  the GI side effect, also higher dose of gabapentin up to 600 mg every night  Return to clinic with nurse practitioner Sarah in 3 to 4 months, Marcial Pacas, M.D. Ph.D.  [facility] [address] [place], [place] [address] Ph: [phone] Fax: [phone]  CC:  Lahoma Rocker, MD [address] [place],  [place] [address]  [name], DO

## 2020-02-08 ENCOUNTER — Encounter: Payer: Medicare Other | Admitting: Diagnostic Neuroimaging

## 2020-02-14 ENCOUNTER — Other Ambulatory Visit: Payer: Self-pay | Admitting: Neurology

## 2020-02-23 DIAGNOSIS — Z23 Encounter for immunization: Secondary | ICD-10-CM | POA: Diagnosis not present

## 2020-02-23 DIAGNOSIS — R748 Abnormal levels of other serum enzymes: Secondary | ICD-10-CM | POA: Diagnosis not present

## 2020-02-23 DIAGNOSIS — M2550 Pain in unspecified joint: Secondary | ICD-10-CM | POA: Diagnosis not present

## 2020-02-23 DIAGNOSIS — M791 Myalgia, unspecified site: Secondary | ICD-10-CM | POA: Diagnosis not present

## 2020-02-23 DIAGNOSIS — M7989 Other specified soft tissue disorders: Secondary | ICD-10-CM | POA: Diagnosis not present

## 2020-02-23 DIAGNOSIS — M79606 Pain in leg, unspecified: Secondary | ICD-10-CM | POA: Diagnosis not present

## 2020-04-05 DIAGNOSIS — I10 Essential (primary) hypertension: Secondary | ICD-10-CM | POA: Diagnosis not present

## 2020-04-05 DIAGNOSIS — E7800 Pure hypercholesterolemia, unspecified: Secondary | ICD-10-CM | POA: Diagnosis not present

## 2020-04-05 DIAGNOSIS — E559 Vitamin D deficiency, unspecified: Secondary | ICD-10-CM | POA: Diagnosis not present

## 2020-04-05 DIAGNOSIS — E119 Type 2 diabetes mellitus without complications: Secondary | ICD-10-CM | POA: Diagnosis not present

## 2020-04-17 ENCOUNTER — Encounter: Payer: Self-pay | Admitting: Neurology

## 2020-04-17 ENCOUNTER — Ambulatory Visit (INDEPENDENT_AMBULATORY_CARE_PROVIDER_SITE_OTHER): Payer: Medicare Other | Admitting: Neurology

## 2020-04-17 VITALS — Ht 64.0 in | Wt 176.5 lb

## 2020-04-17 DIAGNOSIS — R202 Paresthesia of skin: Secondary | ICD-10-CM | POA: Diagnosis not present

## 2020-04-17 DIAGNOSIS — R52 Pain, unspecified: Secondary | ICD-10-CM

## 2020-04-17 MED ORDER — GABAPENTIN 300 MG PO CAPS: 300.0000 mg | ORAL_CAPSULE | Freq: Four times a day (QID) | ORAL | 4 refills | Status: DC

## 2020-04-17 MED ORDER — DULOXETINE HCL 60 MG PO CPEP: 60.0000 mg | ORAL_CAPSULE | Freq: Every day | ORAL | 4 refills | Status: DC

## 2020-04-17 NOTE — Progress Notes (Signed)
Chief Complaint  Patient presents with  . Numbness    She is here with her sister, Sabrina Suarez. Currently taking gabapentin as follows: two 159m capsules along with one 3011mcapsule for a total of 50065mt bedtime. She is also taking duloxetine 29m65mne capsule daily. States her symptoms are well controlled during the day but she often wakes up multiple times during the night.     HISTORICAL  Sabrina Suarez 74 y31r old female, seen in request by rheumatologist Dr. AryaLahoma Suarez evaluation of bilateral feet paresthesia, diffuse body achy pain, her primary care physician is Dr. CollTheda SersnaHinton Dyeritial evaluation was on January 23, 2020  I reviewed and summarized the referring note.  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 or pyoderma gangrenosum2008, she was treated with a large dose of prednisone for 3 to 4 years, patient recalled the dosage was up to 80 mg daily, during that period of time, she has developed diabetes, shortly afterwards she also developed diffuse body achy pain, mainly at bilateral lower extremity, she described burning ankle, pain to the bone, especially when bearing weight, but she denies bilateral plantar surface and toes paresthesia,  Over the years, her skin condition has much improved, did require wound care in the past, had large size scars, but she was tapered off steroid treatment, her diabetes has improved as well  But she continue to complain of  diffuse body achy pain, bilateral ankle pain, she described burning sensation, swelling, especially after bearing weight, the pain are deep to the bone, she has been taking low-dose gabapentin 100 mg every  night, has been helpful, sometimes over-the-counter cream was helpful  Laboratory evaluation seen August 2021, mild elevated C-reactive protein 12, CPK 358, rheumatoid factor within normal limit 10.9, ESR of 22, CCP antibody was negative, ANA was negative  Update January 29, 2020: Laboratory evaluation showed normal or negative ANCA, ANA, ESR, HIV, B12,K74R, folic acid, TSH,  C-reactive protein was mildly elevated 14, ESR was normal 26, CPK was slightly elevated 201  Since Cymbalta 30 mg daily in September 2021, her symptoms has much improved, reported 70% improvement, but she complains of GI side effect, burning, nauseous sensation, she is already taking gabapentin 300 mg every night, only sleeps 3 to 4 hours, wake up hurting,  EMG nerve conduction study today is normal  UPDATE Apr 17 2020: Her whole body achy pain especially bilateral lower extremity pain pain has much improved with symptomatic treatment, she cannot tolerate 30 mg without much GI side effect, she is also taking gabapentin 300+200 mg every night, sleeps well most of the time, but a couple times each week she woke up with lower extremity achy pain,  REVIEW OF SYSTEMS: Full 14 system review of systems performed and notable only for as above All other review of systems were negative.  ALLERGIES: Allergies  Allergen Reactions  . Benadryl [Diphenhydramine Hcl (Sleep)] Nausea And Vomiting  . Sudafed [Pseudoephedrine Hcl]     HOME MEDICATIONS: Current Outpatient Medications  Medication Sig Dispense Refill  . amLODipine (NORVASC) 5 MG tablet Take 5 mg by mouth daily.    . DULoxetine (CYMBALTA) 30 MG capsule TAKE 1 CAPSULE BY MOUTH EVERY DAY 90 capsule 3  . esomeprazole (NEXIUM)  40 MG capsule Take 40 mg by mouth daily at 12 noon.    . gabapentin (NEURONTIN) 100 MG capsule Take 1 capsule (100 mg total) by mouth 3 (three) times daily. 90 capsule 11  . gabapentin (NEURONTIN) 300 MG capsule Take 300 mg by mouth at bedtime. She  is taking this dosage along with two capsules of the 122m. Total of 5028mat bedtime.    . hydrALAZINE (APRESOLINE) 50 MG tablet Take 50 mg by mouth 3 (three) times daily.     . Marland Kitchenoratadine (CLARITIN) 10 MG tablet Take 10 mg by mouth daily.    . rosuvastatin (CRESTOR) 20 MG tablet Take 20 mg by mouth daily.    . Vitamin D, Ergocalciferol, (DRISDOL) 1.25 MG (50000 UNIT) CAPS capsule Take 50,000 Units by mouth once a week.     No current facility-administered medications for this visit.    PAST MEDICAL HISTORY: Past Medical History:  Diagnosis Date  . Diabetes mellitus [place] complication ([place]  . GERD (gastroesophageal reflux disease)   . Hyperlipemia   . Hypertension   . Irregular heart beats   . Pain in both lower legs   . Peripheral neuropathy   . Vitamin D deficiency     PAST SURGICAL HISTORY: Past Surgical History:  Procedure Laterality Date  . ABDOMINAL HYSTERECTOMY    . bladder tack    . COLONOSCOPY      FAMILY HISTORY: Family History  Problem Relation Age of Onset  . Aneurysm Mother        abdominal  . Heart attack Father     SOCIAL HISTORY: Social History   Socioeconomic History  . Marital status: Widowed    Spouse name: Not on file  . Number of children: 2  . Years of education: GED  . Highest education level: Not on file  Occupational History  . Occupation: Retired  Tobacco Use  . Smoking status: Never Smoker  . Smokeless tobacco: Never Used  Substance and Sexual Activity  . Alcohol use: No  . Drug use: No  . Sexual activity: Not on file  Other Topics Concern  . Not on file  Social History Narrative   Lives at home with her daughter.   Right-handed.   Caffeine use: 1 cup per day.   Social Determinants of Health   Financial Resource Strain:   . Difficulty of Paying Living Expenses: Not on file  Food Insecurity:   . Worried About RuCharity fundraisern the Last Year: Not on file  . Ran Out of Food in the Last Year: Not on file   Transportation Needs:   . Lack of Transportation (Medical): Not on file  . Lack of Transportation (Non-Medical): Not on file  Physical Activity:   . Days of Exercise per Week: Not on file  . Minutes of Exercise per Session: Not on file  Stress:   . Feeling of Stress : Not on file  Social Connections:   . Frequency of Communication with Friends and Family: Not on file  . Frequency of Social Gatherings with Friends and Family: Not on file  . Attends Religious Services: Not on file  . Active Member of Clubs or Organizations: Not on file  . Attends ClArchivisteetings: Not on file  . Marital Status: Not on file  Intimate Partner Violence:   . Fear of Current or Ex-Partner: Not on file  . Emotionally Abused: Not on file  . Physically Abused: Not on file  .  Sexually Abused: Not on file     PHYSICAL EXAM   Vitals:   04/17/20 1100  Weight: 176 lb 8 oz (80.1 kg)  Height: '5\' 4"'  (1.626 m)   Not recorded     Body mass index is 30.3 kg/m.  PHYSICAL EXAMNIATION:  Gen: NAD, conversant, well nourised, well groomed         NEUROLOGICAL EXAM:  MENTAL STATUS: Speech/cognition: Awake, alert oriented to history taking care of conversation CRANIAL NERVES: CN II: Visual fields are full to confrontation. Pupils are round equal and briskly reactive to light. CN III, IV, VI: extraocular movement are normal. No ptosis. CN V: Facial sensation is intact to light touch CN VII: Face is symmetric with normal eye closure  CN VIII: Hearing is normal to causal conversation. CN IX, X: Phonation is normal. CN XI: Head turning and shoulder shrug are intact  MOTOR: Normal muscle tone, bulk, strength, but complains of tenderness of bilateral anterior shin  with deep palpitation  REFLEXES: Reflexes are 1 and symmetric at the biceps, triceps, knees, and ankles. Plantar responses are flexor.  SENSORY: Intact to light touch, pinprick and vibratory sensation are intact in fingers and  toes.  COORDINATION: There is no trunk or limb dysmetria noted.  GAIT/STANCE: Need to push up to get up from seated position, antalgic,  DIAGNOSTIC DATA (LABS, IMAGING, TESTING) - I reviewed patient records, labs, notes, testing and imaging myself where available.   ASSESSMENT AND PLAN  DOREEN GARRETSON is a 74 y.o. female   History of autoimmune disease, was treated with high-dose of steroid from 2008-2011, Gradual onset diffuse body achy pain, bilateral lower extremity paresthesia  Normal EMG nerve conduction study, no evidence of large fiber peripheral neuropathy, or intrinsic muscle disease,  Laboratory evaluation showed no significant abnormality,  Symptoms improved with Cymbalta 30 mg daily, initially complains of GI side effect, will increase to 60 mg every day   she also responded well to gabapentin, will increase gabapentin up to 300 mg 4 tablets each day  Return to clinic in 6 months  Marcial Pacas, M.D. Ph.D.  [facility] [address], [place], [place] [address] Ph: [phone] Fax: [phone]  CC:  Sabrina Rocker, MD [address] [place],  [place] [address]  [name], DO

## 2020-04-19 DIAGNOSIS — R946 Abnormal results of thyroid function studies: Secondary | ICD-10-CM | POA: Diagnosis not present

## 2020-04-19 DIAGNOSIS — E7800 Pure hypercholesterolemia, unspecified: Secondary | ICD-10-CM | POA: Diagnosis not present

## 2020-04-19 DIAGNOSIS — E559 Vitamin D deficiency, unspecified: Secondary | ICD-10-CM | POA: Diagnosis not present

## 2020-04-19 DIAGNOSIS — I10 Essential (primary) hypertension: Secondary | ICD-10-CM | POA: Diagnosis not present

## 2020-04-19 DIAGNOSIS — E119 Type 2 diabetes mellitus without complications: Secondary | ICD-10-CM | POA: Diagnosis not present

## 2020-05-30 ENCOUNTER — Ambulatory Visit: Payer: Medicare Other | Admitting: Neurology

## 2020-08-16 DIAGNOSIS — E559 Vitamin D deficiency, unspecified: Secondary | ICD-10-CM | POA: Diagnosis not present

## 2020-08-16 DIAGNOSIS — Z7689 Persons encountering health services in other specified circumstances: Secondary | ICD-10-CM | POA: Diagnosis not present

## 2020-08-16 DIAGNOSIS — I10 Essential (primary) hypertension: Secondary | ICD-10-CM | POA: Diagnosis not present

## 2020-08-16 DIAGNOSIS — E119 Type 2 diabetes mellitus without complications: Secondary | ICD-10-CM | POA: Diagnosis not present

## 2020-08-16 DIAGNOSIS — E7800 Pure hypercholesterolemia, unspecified: Secondary | ICD-10-CM | POA: Diagnosis not present

## 2020-08-16 DIAGNOSIS — R946 Abnormal results of thyroid function studies: Secondary | ICD-10-CM | POA: Diagnosis not present

## 2020-08-23 DIAGNOSIS — I10 Essential (primary) hypertension: Secondary | ICD-10-CM | POA: Diagnosis not present

## 2020-08-23 DIAGNOSIS — E785 Hyperlipidemia, unspecified: Secondary | ICD-10-CM | POA: Diagnosis not present

## 2020-08-23 DIAGNOSIS — Z79899 Other long term (current) drug therapy: Secondary | ICD-10-CM | POA: Diagnosis not present

## 2020-08-23 DIAGNOSIS — E559 Vitamin D deficiency, unspecified: Secondary | ICD-10-CM | POA: Diagnosis not present

## 2020-08-23 DIAGNOSIS — R202 Paresthesia of skin: Secondary | ICD-10-CM | POA: Diagnosis not present

## 2020-08-23 DIAGNOSIS — E119 Type 2 diabetes mellitus without complications: Secondary | ICD-10-CM | POA: Diagnosis not present

## 2020-08-23 DIAGNOSIS — Z0000 Encounter for general adult medical examination without abnormal findings: Secondary | ICD-10-CM | POA: Diagnosis not present

## 2020-08-23 DIAGNOSIS — K219 Gastro-esophageal reflux disease without esophagitis: Secondary | ICD-10-CM | POA: Diagnosis not present

## 2020-10-17 ENCOUNTER — Encounter: Payer: Self-pay | Admitting: Neurology

## 2020-10-17 ENCOUNTER — Other Ambulatory Visit: Payer: Self-pay

## 2020-10-17 ENCOUNTER — Ambulatory Visit: Payer: Medicare Other | Admitting: Neurology

## 2020-10-17 VITALS — BP 150/94 | HR 90 | Ht 64.0 in | Wt 175.0 lb

## 2020-10-17 DIAGNOSIS — R202 Paresthesia of skin: Secondary | ICD-10-CM

## 2020-10-17 MED ORDER — GABAPENTIN 300 MG PO CAPS: 300.0000 mg | ORAL_CAPSULE | Freq: Four times a day (QID) | ORAL | 4 refills | Status: DC

## 2020-10-17 MED ORDER — DULOXETINE HCL 60 MG PO CPEP: 60.0000 mg | ORAL_CAPSULE | Freq: Every day | ORAL | 4 refills | Status: DC

## 2020-10-17 NOTE — Patient Instructions (Signed)
Continue current medications See you back in 1 year

## 2020-10-17 NOTE — Progress Notes (Signed)
Chief Complaint  Patient presents with  . Follow-up    Room 15, with sister, states she is stable and doing much better     HISTORICAL  Sabrina Suarez is a 75 year old female, seen in request by rheumatologist Dr. Lahoma Rocker for evaluation of bilateral feet paresthesia, diffuse body achy pain, her primary care physician is Dr. Theda Sers, [name], initial evaluation was on January 23, 2020  I reviewed and summarized the referring note.  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 or pyoderma gangrenosum2008, she was treated with a large dose of prednisone for 3 to 4 years, patient recalled the dosage was up to 80 mg daily, during that period of time, she has developed diabetes, shortly afterwards she also developed diffuse body achy pain, mainly at bilateral lower extremity, she described burning ankle, pain to the bone, especially when bearing weight, but she denies bilateral plantar surface and toes paresthesia,  Over the years, her skin condition has much improved, did require wound care in the past, had large size scars, but she was tapered off steroid treatment, her diabetes has improved as well  But she continue to complain of  diffuse body achy pain, bilateral ankle pain, she described burning sensation, swelling, especially after bearing weight, the pain are deep to the bone, she has been taking low-dose gabapentin 100 mg every night, has been helpful, sometimes over-the-counter cream was helpful  Laboratory evaluation seen August 2021, mild elevated C-reactive protein 12, CPK 358, rheumatoid factor within normal limit 10.9, ESR of 22, CCP antibody was negative, ANA was  negative  Update January 29, 2020: Laboratory evaluation showed normal or negative ANCA, ANA, ESR, HIV, J44, RPR, folic acid, TSH,  C-reactive protein was mildly elevated 14, ESR was normal 26, CPK was slightly elevated 201  Since Cymbalta 30 mg daily in September 2021, her symptoms has much improved, reported 70% improvement, but she complains of GI side effect, burning, nauseous sensation, she is already taking gabapentin 300 mg every night, only sleeps 3 to 4 hours, wake up hurting,  EMG nerve conduction study today is normal  UPDATE Apr 17 2020: Her whole body achy pain especially bilateral lower extremity pain pain has much improved with symptomatic treatment, she cannot tolerate 30 mg without much GI side effect, she is also taking gabapentin 300+200 mg every night, sleeps well most of the time, but a couple times each week she woke up with lower extremity achy pain,  Update October 17, 2020 SS: Doing much better, her whole achy body pain has improved 95%, very thankful for Cymbalta and gabapentin.  Could not tolerate addition of 100 mg tablets.  Currently taking gabapentin 400 mg 4 times daily, Cymbalta 60 mg daily.  This dosage seems to agree with her, and offer great benefit.  Walking has improved, occasionally may stumble, but no full falls.  Lives with her daughter, prefers not to drive.  Here today accompanied with her Sabrina Suarez.  Has borderline diabetes.  REVIEW OF SYSTEMS: Full 14 system review of systems performed and notable only for as above  See HPI  ALLERGIES: Allergies  Allergen Reactions  . Benadryl [Diphenhydramine Hcl (Sleep)] Nausea And Vomiting  . Sudafed [Pseudoephedrine Hcl]   . Asa [Aspirin] Rash  HOME MEDICATIONS: Current Outpatient Medications  Medication Sig Dispense Refill  . amLODipine (NORVASC) 5 MG tablet Take 5 mg by mouth daily.    . DULoxetine (CYMBALTA) 60 MG capsule Take 1 capsule (60 mg total) by mouth daily. 90 capsule 4  . esomeprazole  (NEXIUM) 40 MG capsule Take 40 mg by mouth daily at 12 noon.    . gabapentin (NEURONTIN) 100 MG capsule Take 1 capsule (100 mg total) by mouth 3 (three) times daily. 90 capsule 11  . gabapentin (NEURONTIN) 300 MG capsule Take 1 capsule (300 mg total) by mouth 4 (four) times daily. She is taking this dosage along with two capsules of the 153m. Total of 5021mat bedtime. 360 capsule 4  . hydrALAZINE (APRESOLINE) 50 MG tablet Take 50 mg by mouth 3 (three) times daily.     . Marland Kitchenoratadine (CLARITIN) 10 MG tablet Take 10 mg by mouth daily.    . rosuvastatin (CRESTOR) 20 MG tablet Take 20 mg by mouth daily. 3 times a week     No current facility-administered medications for this visit.    PAST MEDICAL HISTORY: Past Medical History:  Diagnosis Date  . [place] mellitus [place] complication ([place]  . GERD (gastroesophageal reflux disease)   . Hyperlipemia   . Hypertension   . Irregular heart beats   . Pain in both lower legs   . Peripheral neuropathy   . Vitamin D deficiency     PAST SURGICAL HISTORY: Past Surgical History:  Procedure Laterality Date  . ABDOMINAL HYSTERECTOMY    . bladder tack    . COLONOSCOPY      FAMILY HISTORY: Family History  Problem Relation Age of Onset  . Aneurysm Mother        abdominal  . Heart attack Father     SOCIAL HISTORY: Social History   Socioeconomic History  . Marital status: Widowed    Spouse name: Not on file  . Number of children: 2  . Years of education: GED  . Highest education level: Not on file  Occupational History  . Occupation: Retired  Tobacco Use  . Smoking status: Never Smoker  . Smokeless tobacco: Never Used  Substance and Sexual Activity  . Alcohol use: No  . Drug use: No  . Sexual activity: Not on file  Other Topics Concern  . Not on file  Social History Narrative   Lives at home with her daughter.   Right-handed.   Caffeine use: 1 cup per day.   Social Determinants of Health   Financial Resource Strain: Not on  file  Food Insecurity: Not on file  Transportation Needs: Not on file  Physical Activity: Not on file  Stress: Not on file  Social Connections: Not on file  Intimate Partner Violence: Not on file   PHYSICAL EXAM   Vitals:   10/17/20 0958  BP: (!) 150/94  Pulse: 90  Weight: 175 lb (79.4 kg)  Height: _0  (1.626 m)   Not recorded    Body mass index is 30.04 kg/m.  Physical Exam  General: The patient is alert and cooperative at the time of the examination. Very pleasant, engaged.   Skin: No significant peripheral edema is noted.  Neurologic Exam  Mental status: The patient is alert and oriented x 3 at the time of the examination. The patient has apparent normal recent and remote memory, with an apparently normal attention span and concentration ability.  Cranial nerves: Facial symmetry is present. Speech is normal, no aphasia  or dysarthria is noted. Extraocular movements are full. Visual fields are full.  Motor: The patient has good strength in all 4 extremities.  Sensory examination: Soft touch sensation is symmetric on the face, arms, and legs.  Coordination: The patient has good finger-nose-finger and heel-to-shin bilaterally.  Gait and station: The patient has a normal gait, is independent, slightly cautious  Reflexes: Deep tendon reflexes are symmetric, decreased throughout  DIAGNOSTIC DATA (LABS, IMAGING, TESTING) - I reviewed patient records, labs, notes, testing and imaging myself where available.  ASSESSMENT AND PLAN  Sabrina Suarez is a 75 y.o. female   1. History of autoimmune disease, was treated with high-dose of steroid from 2008-2011  2. Gradual onset diffuse body achy pain, bilateral lower extremity paresthesia  -Doing much better, 95% improvement in regards to achy body pain -Continue Cymbalta 60 mg daily -Continue gabapentin 400 mg 4 times daily, will discontinue the 100 mg capsules -Normal EMG nerve conduction study, no evidence of large  fiber peripheral neuropathy, or intrinsic muscle disease -Laboratory evaluation showed no significant abnormality, -Continue follow-up with PCP, return to our clinic in 1 year or sooner if needed  Butler Denmark, Laqueta Jean, [facility] [address] [place], [place] [address] [phone]

## 2020-12-20 DIAGNOSIS — E119 Type 2 diabetes mellitus without complications: Secondary | ICD-10-CM | POA: Diagnosis not present

## 2020-12-20 DIAGNOSIS — Z79899 Other long term (current) drug therapy: Secondary | ICD-10-CM | POA: Diagnosis not present

## 2020-12-20 DIAGNOSIS — E559 Vitamin D deficiency, unspecified: Secondary | ICD-10-CM | POA: Diagnosis not present

## 2021-01-27 DIAGNOSIS — E785 Hyperlipidemia, unspecified: Secondary | ICD-10-CM | POA: Diagnosis not present

## 2021-01-27 DIAGNOSIS — Z23 Encounter for immunization: Secondary | ICD-10-CM | POA: Diagnosis not present

## 2021-01-27 DIAGNOSIS — I10 Essential (primary) hypertension: Secondary | ICD-10-CM | POA: Diagnosis not present

## 2021-01-27 DIAGNOSIS — R202 Paresthesia of skin: Secondary | ICD-10-CM | POA: Diagnosis not present

## 2021-01-27 DIAGNOSIS — E119 Type 2 diabetes mellitus without complications: Secondary | ICD-10-CM | POA: Diagnosis not present

## 2021-05-26 DIAGNOSIS — I10 Essential (primary) hypertension: Secondary | ICD-10-CM | POA: Diagnosis not present

## 2021-05-26 DIAGNOSIS — E119 Type 2 diabetes mellitus without complications: Secondary | ICD-10-CM | POA: Diagnosis not present

## 2021-05-26 DIAGNOSIS — E785 Hyperlipidemia, unspecified: Secondary | ICD-10-CM | POA: Diagnosis not present

## 2021-06-02 DIAGNOSIS — E119 Type 2 diabetes mellitus without complications: Secondary | ICD-10-CM | POA: Diagnosis not present

## 2021-06-02 DIAGNOSIS — R202 Paresthesia of skin: Secondary | ICD-10-CM | POA: Diagnosis not present

## 2021-06-02 DIAGNOSIS — D649 Anemia, unspecified: Secondary | ICD-10-CM | POA: Diagnosis not present

## 2021-06-02 DIAGNOSIS — K219 Gastro-esophageal reflux disease without esophagitis: Secondary | ICD-10-CM | POA: Diagnosis not present

## 2021-06-02 DIAGNOSIS — E559 Vitamin D deficiency, unspecified: Secondary | ICD-10-CM | POA: Diagnosis not present

## 2021-06-02 DIAGNOSIS — I10 Essential (primary) hypertension: Secondary | ICD-10-CM | POA: Diagnosis not present

## 2021-06-02 DIAGNOSIS — E785 Hyperlipidemia, unspecified: Secondary | ICD-10-CM | POA: Diagnosis not present

## 2021-06-30 DIAGNOSIS — R197 Diarrhea, unspecified: Secondary | ICD-10-CM | POA: Diagnosis not present

## 2021-06-30 DIAGNOSIS — D509 Iron deficiency anemia, unspecified: Secondary | ICD-10-CM | POA: Diagnosis not present

## 2021-06-30 DIAGNOSIS — Z86010 Personal history of colonic polyps: Secondary | ICD-10-CM | POA: Diagnosis not present

## 2021-08-21 DIAGNOSIS — K3189 Other diseases of stomach and duodenum: Secondary | ICD-10-CM | POA: Diagnosis not present

## 2021-08-21 DIAGNOSIS — D509 Iron deficiency anemia, unspecified: Secondary | ICD-10-CM | POA: Diagnosis not present

## 2021-08-21 DIAGNOSIS — K2970 Gastritis, unspecified, without bleeding: Secondary | ICD-10-CM | POA: Diagnosis not present

## 2021-08-21 DIAGNOSIS — K635 Polyp of colon: Secondary | ICD-10-CM | POA: Diagnosis not present

## 2021-08-21 DIAGNOSIS — K319 Disease of stomach and duodenum, unspecified: Secondary | ICD-10-CM | POA: Diagnosis not present

## 2021-08-21 DIAGNOSIS — K5730 Diverticulosis of large intestine without perforation or abscess without bleeding: Secondary | ICD-10-CM | POA: Diagnosis not present

## 2021-08-21 DIAGNOSIS — D123 Benign neoplasm of transverse colon: Secondary | ICD-10-CM | POA: Diagnosis not present

## 2021-09-01 DIAGNOSIS — E559 Vitamin D deficiency, unspecified: Secondary | ICD-10-CM | POA: Diagnosis not present

## 2021-09-01 DIAGNOSIS — I10 Essential (primary) hypertension: Secondary | ICD-10-CM | POA: Diagnosis not present

## 2021-09-01 DIAGNOSIS — D649 Anemia, unspecified: Secondary | ICD-10-CM | POA: Diagnosis not present

## 2021-09-01 DIAGNOSIS — E119 Type 2 diabetes mellitus without complications: Secondary | ICD-10-CM | POA: Diagnosis not present

## 2021-09-01 DIAGNOSIS — E785 Hyperlipidemia, unspecified: Secondary | ICD-10-CM | POA: Diagnosis not present

## 2021-09-08 DIAGNOSIS — R202 Paresthesia of skin: Secondary | ICD-10-CM | POA: Diagnosis not present

## 2021-09-08 DIAGNOSIS — E119 Type 2 diabetes mellitus without complications: Secondary | ICD-10-CM | POA: Diagnosis not present

## 2021-09-08 DIAGNOSIS — I493 Ventricular premature depolarization: Secondary | ICD-10-CM | POA: Diagnosis not present

## 2021-09-08 DIAGNOSIS — Z780 Asymptomatic menopausal state: Secondary | ICD-10-CM | POA: Diagnosis not present

## 2021-09-08 DIAGNOSIS — I10 Essential (primary) hypertension: Secondary | ICD-10-CM | POA: Diagnosis not present

## 2021-09-08 DIAGNOSIS — Z0000 Encounter for general adult medical examination without abnormal findings: Secondary | ICD-10-CM | POA: Diagnosis not present

## 2021-09-08 DIAGNOSIS — E213 Hyperparathyroidism, unspecified: Secondary | ICD-10-CM | POA: Diagnosis not present

## 2021-09-22 DIAGNOSIS — D509 Iron deficiency anemia, unspecified: Secondary | ICD-10-CM | POA: Diagnosis not present

## 2021-09-22 DIAGNOSIS — K2970 Gastritis, unspecified, without bleeding: Secondary | ICD-10-CM | POA: Diagnosis not present

## 2021-10-21 ENCOUNTER — Other Ambulatory Visit: Payer: Self-pay | Admitting: Neurology

## 2021-10-23 ENCOUNTER — Encounter: Payer: Self-pay | Admitting: Neurology

## 2021-10-23 ENCOUNTER — Ambulatory Visit: Payer: Medicare Other | Admitting: Neurology

## 2021-10-23 NOTE — Progress Notes (Deleted)
No chief complaint on file.   HISTORICAL  Sabrina Suarez is a 76 year old female, seen in request by rheumatologist Dr. Lahoma Suarez for evaluation of bilateral feet paresthesia, diffuse body achy pain, her primary care physician is Dr. Theda Suarez, Sabrina Suarez, initial evaluation was on January 23, 2020  I reviewed and summarized the referring note.  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 or pyoderma gangrenosum2008, she was treated with a large dose of prednisone for 3 to 4 years, patient recalled the dosage was up to 80 mg daily, during that period of time, she has developed diabetes, shortly afterwards she also developed diffuse body achy pain, mainly at bilateral lower extremity, she described burning ankle, pain to the bone, especially when bearing weight, but she denies bilateral plantar surface and toes paresthesia,  Over the years, her skin condition has much improved, did require wound care in the past, had large size scars, but she was tapered off steroid treatment, her diabetes has improved as well  But she continue to complain of  diffuse body achy pain, bilateral ankle pain, she described burning sensation, swelling, especially after bearing weight, the pain are deep to the bone, she has been taking low-dose gabapentin 100 mg every night, has been helpful, sometimes over-the-counter cream was helpful  Laboratory evaluation seen August 2021, mild elevated C-reactive protein 12, CPK 358, rheumatoid factor within normal limit 10.9, ESR of 22, CCP antibody was negative, ANA was negative  Update January 29, 2020: Laboratory evaluation showed normal or negative ANCA, ANA, ESR, HIV,  F81, RPR, folic acid, TSH,  C-reactive protein was mildly elevated 14, ESR was normal 26, CPK was slightly elevated 201  Since Cymbalta 30 mg daily in September 2021, her symptoms has much improved, reported 70% improvement, but she complains of GI side effect, burning, nauseous sensation, she is already taking gabapentin 300 mg every night, only sleeps 3 to 4 hours, wake up hurting,  EMG nerve conduction study today is normal  UPDATE Apr 17 2020: Her whole body achy pain especially bilateral lower extremity pain pain has much improved with symptomatic treatment, she cannot tolerate 30 mg without much GI side effect, she is also taking gabapentin 300+200 mg every night, sleeps well most of the time, but a couple times each week she woke up with lower extremity achy pain,  Update October 17, 2020 SS: Doing much better, her whole achy body pain has improved 95%, very thankful for Cymbalta and gabapentin.  Could not tolerate addition of 100 mg tablets.  Currently taking gabapentin 400 mg 4 times daily, Cymbalta 60 mg daily.  This dosage seems to agree with her, and offer great benefit.  Walking has improved, occasionally may stumble, but no full falls.  Lives with her daughter, prefers not to drive.  Here today accompanied with her Sabrina Suarez.  Has borderline diabetes.  Update 10/23/21 SS:   REVIEW OF SYSTEMS: Full 14 system review of systems performed and notable only for as above  See HPI  ALLERGIES: Allergies  Allergen Reactions   Benadryl [Diphenhydramine Hcl (Sleep)] Nausea And Vomiting   Sudafed [Pseudoephedrine Hcl]    Asa [Aspirin] Rash    HOME MEDICATIONS: Current Outpatient Medications  Medication Sig Dispense Refill   amLODipine (NORVASC) 5  MG tablet Take 5 mg by mouth daily.     DULoxetine (CYMBALTA) 60 MG capsule Take 1 capsule (60 mg total) by mouth daily. 90 capsule 4   esomeprazole (NEXIUM) 40 MG capsule Take 40 mg by mouth daily at 12 noon.     gabapentin (NEURONTIN) 300 MG  capsule Take 1 capsule (300 mg total) by mouth 4 (four) times daily. 360 capsule 0   hydrALAZINE (APRESOLINE) 50 MG tablet Take 50 mg by mouth 3 (three) times daily.      loratadine (CLARITIN) 10 MG tablet Take 10 mg by mouth daily.     rosuvastatin (CRESTOR) 20 MG tablet Take 20 mg by mouth daily. 3 times a week     No current facility-administered medications for this visit.    PAST MEDICAL HISTORY: Past Medical History:  Diagnosis Date   Diabetes mellitus without complication (HCC)    GERD (gastroesophageal reflux disease)    Hyperlipemia    Hypertension    Irregular heart beats    Pain in both lower legs    Peripheral neuropathy    Vitamin D deficiency     PAST SURGICAL HISTORY: Past Surgical History:  Procedure Laterality Date   ABDOMINAL HYSTERECTOMY     bladder tack     COLONOSCOPY      FAMILY HISTORY: Family History  Problem Relation Age of Onset   Aneurysm Mother        abdominal   Heart attack Father     SOCIAL HISTORY: Social History   Socioeconomic History   Marital status: Widowed    Spouse name: Not on file   Number of children: 2   Years of education: GED   Highest education level: Not on file  Occupational History   Occupation: Retired  Tobacco Use   Smoking status: Never   Smokeless tobacco: Never  Substance and Sexual Activity   Alcohol use: No   Drug use: No   Sexual activity: Not on file  Other Topics Concern   Not on file  Social History Narrative   Lives at home with her daughter.   Right-handed.   Caffeine use: 1 cup per day.   Social Determinants of Health   Financial Resource Strain: Not on file  Food Insecurity: Not on file  Transportation Needs: Not on file  Physical Activity: Not on file  Stress: Not on file  Social Connections: Not on file  Intimate Partner Violence: Not on file   PHYSICAL EXAM   There were no vitals filed for this visit.  Not recorded    There is no height or weight on file to calculate  BMI.  Physical Exam  General: The patient is alert and cooperative at the time of the examination. Very pleasant, engaged.   Skin: No significant peripheral edema is noted.  Neurologic Exam  Mental status: The patient is alert and oriented x 3 at the time of the examination. The patient has apparent normal recent and remote memory, with an apparently normal attention span and concentration ability.  Cranial nerves: Facial symmetry is present. Speech is normal, no aphasia or dysarthria is noted. Extraocular movements are full. Visual fields are full.  Motor: The patient has good strength in all 4 extremities.  Sensory examination: Soft touch sensation is symmetric on the face, arms, and legs.  Coordination: The patient has good finger-nose-finger and heel-to-shin bilaterally.  Gait and station: The patient has a normal gait, is independent, slightly cautious  Reflexes: Deep tendon reflexes are symmetric,  decreased throughout  DIAGNOSTIC DATA (LABS, IMAGING, TESTING) - I reviewed patient records, labs, notes, testing and imaging myself where available.  ASSESSMENT AND PLAN  Sabrina Suarez is a 76 y.o. female   1. History of autoimmune disease, was treated with high-dose of steroid from 2008-2011  2. Gradual onset diffuse body achy pain, bilateral lower extremity paresthesia  -Doing much better, 95% improvement in regards to achy body pain -Continue Cymbalta 60 mg daily -Continue gabapentin 400 mg 4 times daily, will discontinue the 100 mg capsules -Normal EMG nerve conduction study, no evidence of large fiber peripheral neuropathy, or intrinsic muscle disease -Laboratory evaluation showed no significant abnormality, -Continue follow-up with PCP, return to our clinic in 1 year or sooner if needed  Butler Denmark, Laqueta Jean, [facility] [address], [place], [place] [address] [phone]

## 2021-11-17 DIAGNOSIS — E559 Vitamin D deficiency, unspecified: Secondary | ICD-10-CM | POA: Diagnosis not present

## 2021-11-17 DIAGNOSIS — N189 Chronic kidney disease, unspecified: Secondary | ICD-10-CM | POA: Diagnosis not present

## 2021-11-17 DIAGNOSIS — I10 Essential (primary) hypertension: Secondary | ICD-10-CM | POA: Diagnosis not present

## 2021-11-17 DIAGNOSIS — E213 Hyperparathyroidism, unspecified: Secondary | ICD-10-CM | POA: Diagnosis not present

## 2021-12-23 DIAGNOSIS — E119 Type 2 diabetes mellitus without complications: Secondary | ICD-10-CM | POA: Diagnosis not present

## 2021-12-23 DIAGNOSIS — E559 Vitamin D deficiency, unspecified: Secondary | ICD-10-CM | POA: Diagnosis not present

## 2021-12-23 DIAGNOSIS — E213 Hyperparathyroidism, unspecified: Secondary | ICD-10-CM | POA: Diagnosis not present

## 2021-12-23 DIAGNOSIS — I10 Essential (primary) hypertension: Secondary | ICD-10-CM | POA: Diagnosis not present

## 2022-01-05 DIAGNOSIS — E119 Type 2 diabetes mellitus without complications: Secondary | ICD-10-CM | POA: Diagnosis not present

## 2022-01-05 DIAGNOSIS — I10 Essential (primary) hypertension: Secondary | ICD-10-CM | POA: Diagnosis not present

## 2022-01-12 DIAGNOSIS — I129 Hypertensive chronic kidney disease with stage 1 through stage 4 chronic kidney disease, or unspecified chronic kidney disease: Secondary | ICD-10-CM | POA: Diagnosis not present

## 2022-01-12 DIAGNOSIS — E213 Hyperparathyroidism, unspecified: Secondary | ICD-10-CM | POA: Diagnosis not present

## 2022-01-12 DIAGNOSIS — E118 Type 2 diabetes mellitus with unspecified complications: Secondary | ICD-10-CM | POA: Diagnosis not present

## 2022-01-12 DIAGNOSIS — E785 Hyperlipidemia, unspecified: Secondary | ICD-10-CM | POA: Diagnosis not present

## 2022-01-12 DIAGNOSIS — N1831 Chronic kidney disease, stage 3a: Secondary | ICD-10-CM | POA: Diagnosis not present

## 2022-01-12 DIAGNOSIS — D631 Anemia in chronic kidney disease: Secondary | ICD-10-CM | POA: Diagnosis not present

## 2022-01-28 NOTE — Progress Notes (Signed)
Chief Complaint  Patient presents with   Follow-up    Room 15,  States pain in leg still occurs,Gabapentin works well, refills needed     HISTORICAL  Sabrina Suarez is a 76 year old female, seen in request by rheumatologist Dr. Lahoma Rocker for evaluation of bilateral feet paresthesia, diffuse body achy pain, her primary care physician is Dr. Theda Sers, [name], initial evaluation was on January 23, 2020  I reviewed and summarized the referring note.  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 or pyoderma gangrenosum2008, she was treated with a large dose of prednisone for 3 to 4 years, patient recalled the dosage was up to 80 mg daily, during that period of time, she has developed diabetes, shortly afterwards she also developed diffuse body achy pain, mainly at bilateral lower extremity, she described burning ankle, pain to the bone, especially when bearing weight, but she denies bilateral plantar surface and toes paresthesia,  Over the years, her skin condition has much improved, did require wound care in the past, had large size scars, but she was tapered off steroid treatment, her diabetes has improved as well  But she continue to complain of  diffuse body achy pain, bilateral ankle pain, she described burning sensation, swelling, especially after bearing weight, the pain are deep to the bone, she has been taking low-dose gabapentin 100 mg every night, has been helpful, sometimes over-the-counter cream was helpful  Laboratory evaluation seen August 2021, mild elevated C-reactive protein 12, CPK 358, rheumatoid factor within normal limit 10.9, ESR of 22, CCP antibody was negative, ANA  was negative  Update January 29, 2020: Laboratory evaluation showed normal or negative ANCA, ANA, ESR, HIV, Z61, RPR, folic acid, TSH,  C-reactive protein was mildly elevated 14, ESR was normal 26, CPK was slightly elevated 201  Since Cymbalta 30 mg daily in September 2021, her symptoms has much improved, reported 70% improvement, but she complains of GI side effect, burning, nauseous sensation, she is already taking gabapentin 300 mg every night, only sleeps 3 to 4 hours, wake up hurting,  EMG nerve conduction study today is normal  UPDATE Apr 17 2020: Her whole body achy pain especially bilateral lower extremity pain pain has much improved with symptomatic treatment, she cannot tolerate 30 mg without much GI side effect, she is also taking gabapentin 300+200 mg every night, sleeps well most of the time, but a couple times each week she woke up with lower extremity achy pain,  Update October 17, 2020 SS: Doing much better, her whole achy body pain has improved 95%, very thankful for Cymbalta and gabapentin.  Could not tolerate addition of 100 mg tablets.  Currently taking gabapentin 400 mg 4 times daily, Cymbalta 60 mg daily.  This dosage seems to agree with her, and offer great benefit.  Walking has improved, occasionally may stumble, but no full falls.  Lives with her daughter, prefers not to drive.  Here today accompanied with her [name].  Has borderline diabetes.  Update January 29, 2022 SS: here today with sister, Stanton Kidney. No longer taking Cymbalta, she felt it wasn't helpful. Gabapentin helps, 300 mg 4 times daily. Without it, feels achy, pins and needles in her legs. Denies side effects. Getting ready to move into own apartment  close to another sister. Has had anemia, taking iron pills. Sees PCP, labs ok otherwise as far as she knows. No falls.   REVIEW OF SYSTEMS: Full 14 system review of systems performed and notable only for as above  See HPI  ALLERGIES: Allergies  Allergen  Reactions   Benadryl [Diphenhydramine Hcl (Sleep)] Nausea And Vomiting   Sudafed [Pseudoephedrine Hcl]    Asa [Aspirin] Rash    HOME MEDICATIONS: Current Outpatient Medications  Medication Sig Dispense Refill   amLODipine (NORVASC) 5 MG tablet Take 5 mg by mouth daily.     calcitRIOL (ROCALTROL) 0.25 MCG capsule TAKE 1 CAPSULE BY MOUTH EVERY DAY FOR 30 DAYS for 30     DULoxetine (CYMBALTA) 60 MG capsule Take 1 capsule (60 mg total) by mouth daily. 90 capsule 4   esomeprazole (NEXIUM) 40 MG capsule Take 40 mg by mouth daily at 12 noon.     ferrous sulfate 324 (65 Fe) MG TBEC Take 1 tablet by mouth daily.     gabapentin (NEURONTIN) 300 MG capsule Take 1 capsule (300 mg total) by mouth 4 (four) times daily. 360 capsule 0   hydrALAZINE (APRESOLINE) 50 MG tablet Take 50 mg by mouth 3 (three) times daily.      loratadine (CLARITIN) 10 MG tablet Take 10 mg by mouth daily.     rosuvastatin (CRESTOR) 20 MG tablet Take 20 mg by mouth daily. 3 times a week     valsartan-hydrochlorothiazide (DIOVAN-HCT) 320-12.5 MG tablet Take 1 tablet by mouth daily.     No current facility-administered medications for this visit.    PAST MEDICAL HISTORY: Past Medical History:  Diagnosis Date   Diabetes mellitus without complication (HCC)    GERD (gastroesophageal reflux disease)    Hyperlipemia    Hypertension    Irregular heart beats    Pain in both lower legs    Peripheral neuropathy    Vitamin D deficiency     PAST SURGICAL HISTORY: Past Surgical History:  Procedure Laterality Date   ABDOMINAL HYSTERECTOMY     bladder tack     COLONOSCOPY      FAMILY HISTORY: Family History  Problem Relation Age of Onset   Aneurysm Mother        abdominal   Heart attack Father     SOCIAL HISTORY: Social History   Socioeconomic History   Marital status: Widowed    Spouse name: Not on file   Number of children: 2   Years of education: GED   Highest education level: Not on file  Occupational History    Occupation: Retired  Tobacco Use   Smoking status: Never   Smokeless tobacco: Never  Substance and Sexual Activity   Alcohol use: No   Drug use: No   Sexual activity: Not on file  Other Topics Concern   Not on file  Social History Narrative   Lives at home with her daughter.   Right-handed.   Caffeine use: 1 cup per day.   Social Determinants of Health   Financial Resource Strain: Not on file  Food Insecurity: Not on file  Transportation Needs: Not on file  Physical Activity: Not on file  Stress: Not on file  Social Connections: Not on file  Intimate Partner Violence: Not on file   PHYSICAL EXAM   Vitals:   01/29/22 1436  BP: (!) 149/78  Pulse: 82  Weight: 185 lb (83.9 kg)  Height: '5\' 4"'  (1.626 m)    Not recorded  Body mass index is 31.76 kg/m.  Physical Exam  General: The patient is alert and cooperative at the time of the examination. Very nice  Skin: No significant peripheral edema is noted.  Neurologic Exam  Mental status: The patient is alert and oriented x 3 at the time of the examination. The patient has apparent normal recent and remote memory, with an apparently normal attention span and concentration ability.  Cranial nerves: Facial symmetry is present. Speech is normal, no aphasia or dysarthria is noted. Extraocular movements are full. Visual fields are full.  Motor: The patient has good strength in all 4 extremities.  Sensory examination: Soft touch sensation is symmetric on the face, arms, and legs.   Coordination: The patient has good finger-nose-finger and heel-to-shin bilaterally.  Gait and station: The patient has a normal gait, is independent, slightly cautious  Reflexes: Deep tendon reflexes are symmetric, decreased throughout  DIAGNOSTIC DATA (LABS, IMAGING, TESTING) - I reviewed patient records, labs, notes, testing and imaging myself where available.  ASSESSMENT AND PLAN  KIMMY TOTTEN is a 76 y.o. female   1. History of  autoimmune disease, was treated with high-dose of steroid from 2008-2011  2. Gradual onset diffuse body achy pain, bilateral lower extremity paresthesia  -Continues to do well, symptoms are well controlled with just gabapentin -Continue gabapentin 300 mg 4 times daily -She did not feel much benefit from Cymbalta -Normal EMG nerve conduction study, no evidence of large fiber peripheral neuropathy, or intrinsic muscle disease -Laboratory evaluation showed no significant abnormality, -Continue follow-up with PCP, return here on an as-needed basis, PCP can refill going forward  Butler Denmark, Laqueta Jean, [facility] [address] [place], [place] [address] [phone]

## 2022-01-29 ENCOUNTER — Ambulatory Visit (INDEPENDENT_AMBULATORY_CARE_PROVIDER_SITE_OTHER): Payer: No Typology Code available for payment source | Admitting: Neurology

## 2022-01-29 VITALS — BP 149/78 | HR 82 | Ht 64.0 in | Wt 185.0 lb

## 2022-01-29 DIAGNOSIS — Z862 Personal history of diseases of the blood and blood-forming organs and certain disorders involving the immune mechanism: Secondary | ICD-10-CM

## 2022-01-29 DIAGNOSIS — R202 Paresthesia of skin: Secondary | ICD-10-CM | POA: Diagnosis not present

## 2022-01-29 MED ORDER — GABAPENTIN 300 MG PO CAPS: 300.0000 mg | ORAL_CAPSULE | Freq: Four times a day (QID) | ORAL | 1 refills | Status: AC

## 2022-01-29 NOTE — Patient Instructions (Signed)
Can continue the gabapentin  Have primary care doctor refill going forward Return here for new symptoms

## 2022-04-07 DIAGNOSIS — E785 Hyperlipidemia, unspecified: Secondary | ICD-10-CM | POA: Diagnosis not present

## 2022-04-07 DIAGNOSIS — E559 Vitamin D deficiency, unspecified: Secondary | ICD-10-CM | POA: Diagnosis not present

## 2022-04-07 DIAGNOSIS — E118 Type 2 diabetes mellitus with unspecified complications: Secondary | ICD-10-CM | POA: Diagnosis not present

## 2022-04-07 DIAGNOSIS — N1831 Chronic kidney disease, stage 3a: Secondary | ICD-10-CM | POA: Diagnosis not present

## 2022-04-07 DIAGNOSIS — I129 Hypertensive chronic kidney disease with stage 1 through stage 4 chronic kidney disease, or unspecified chronic kidney disease: Secondary | ICD-10-CM | POA: Diagnosis not present

## 2022-04-07 DIAGNOSIS — E213 Hyperparathyroidism, unspecified: Secondary | ICD-10-CM | POA: Diagnosis not present

## 2022-04-07 DIAGNOSIS — E119 Type 2 diabetes mellitus without complications: Secondary | ICD-10-CM | POA: Diagnosis not present

## 2022-04-07 DIAGNOSIS — D631 Anemia in chronic kidney disease: Secondary | ICD-10-CM | POA: Diagnosis not present

## 2022-04-07 DIAGNOSIS — I10 Essential (primary) hypertension: Secondary | ICD-10-CM | POA: Diagnosis not present

## 2022-04-07 DIAGNOSIS — K219 Gastro-esophageal reflux disease without esophagitis: Secondary | ICD-10-CM | POA: Diagnosis not present

## 2022-07-06 DIAGNOSIS — Z79899 Other long term (current) drug therapy: Secondary | ICD-10-CM | POA: Diagnosis not present

## 2022-07-06 DIAGNOSIS — Z13220 Encounter for screening for lipoid disorders: Secondary | ICD-10-CM | POA: Diagnosis not present

## 2022-07-06 DIAGNOSIS — R7309 Other abnormal glucose: Secondary | ICD-10-CM | POA: Diagnosis not present

## 2022-07-06 DIAGNOSIS — E559 Vitamin D deficiency, unspecified: Secondary | ICD-10-CM | POA: Diagnosis not present

## 2022-07-13 DIAGNOSIS — D631 Anemia in chronic kidney disease: Secondary | ICD-10-CM | POA: Diagnosis not present

## 2022-07-13 DIAGNOSIS — I10 Essential (primary) hypertension: Secondary | ICD-10-CM | POA: Diagnosis not present

## 2022-07-13 DIAGNOSIS — E118 Type 2 diabetes mellitus with unspecified complications: Secondary | ICD-10-CM | POA: Diagnosis not present

## 2022-07-13 DIAGNOSIS — E785 Hyperlipidemia, unspecified: Secondary | ICD-10-CM | POA: Diagnosis not present

## 2022-08-30 ENCOUNTER — Other Ambulatory Visit: Payer: Self-pay | Admitting: Neurology

## 2022-08-31 NOTE — Telephone Encounter (Signed)
Requested Prescriptions   Pending Prescriptions Disp Refills   gabapentin (NEURONTIN) 300 MG capsule [Pharmacy Med Name: GABAPENTIN 300 MG CAPSULE] 360 capsule 1    Sig: TAKE 1 CAPSULE BY MOUTH 4 TIMES DAILY.   Last visit with slack 01/29/22, no upcoming visit scheduled for that reason I will refuse refill and state that an appointment is needed

## 2022-09-28 DIAGNOSIS — Z86010 Personal history of colonic polyps: Secondary | ICD-10-CM | POA: Diagnosis not present

## 2022-09-28 DIAGNOSIS — D509 Iron deficiency anemia, unspecified: Secondary | ICD-10-CM | POA: Diagnosis not present

## 2022-10-16 DIAGNOSIS — J988 Other specified respiratory disorders: Secondary | ICD-10-CM | POA: Diagnosis not present

## 2023-01-04 DIAGNOSIS — I129 Hypertensive chronic kidney disease with stage 1 through stage 4 chronic kidney disease, or unspecified chronic kidney disease: Secondary | ICD-10-CM | POA: Diagnosis not present

## 2023-01-04 DIAGNOSIS — E213 Hyperparathyroidism, unspecified: Secondary | ICD-10-CM | POA: Diagnosis not present

## 2023-01-04 DIAGNOSIS — E118 Type 2 diabetes mellitus with unspecified complications: Secondary | ICD-10-CM | POA: Diagnosis not present

## 2023-01-04 DIAGNOSIS — D631 Anemia in chronic kidney disease: Secondary | ICD-10-CM | POA: Diagnosis not present

## 2023-01-04 DIAGNOSIS — N1831 Chronic kidney disease, stage 3a: Secondary | ICD-10-CM | POA: Diagnosis not present

## 2023-01-11 DIAGNOSIS — I129 Hypertensive chronic kidney disease with stage 1 through stage 4 chronic kidney disease, or unspecified chronic kidney disease: Secondary | ICD-10-CM | POA: Diagnosis not present

## 2023-01-11 DIAGNOSIS — D631 Anemia in chronic kidney disease: Secondary | ICD-10-CM | POA: Diagnosis not present

## 2023-01-11 DIAGNOSIS — E213 Hyperparathyroidism, unspecified: Secondary | ICD-10-CM | POA: Diagnosis not present

## 2023-01-11 DIAGNOSIS — E1121 Type 2 diabetes mellitus with diabetic nephropathy: Secondary | ICD-10-CM | POA: Diagnosis not present

## 2023-01-11 DIAGNOSIS — N1831 Chronic kidney disease, stage 3a: Secondary | ICD-10-CM | POA: Diagnosis not present

## 2023-01-11 DIAGNOSIS — Z0000 Encounter for general adult medical examination without abnormal findings: Secondary | ICD-10-CM | POA: Diagnosis not present

## 2023-01-30 DIAGNOSIS — E119 Type 2 diabetes mellitus without complications: Secondary | ICD-10-CM | POA: Diagnosis not present

## 2023-01-30 DIAGNOSIS — H40033 Anatomical narrow angle, bilateral: Secondary | ICD-10-CM | POA: Diagnosis not present

## 2023-03-30 ENCOUNTER — Other Ambulatory Visit (HOSPITAL_COMMUNITY): Payer: Self-pay | Admitting: Family Medicine

## 2023-03-30 DIAGNOSIS — Z1231 Encounter for screening mammogram for malignant neoplasm of breast: Secondary | ICD-10-CM

## 2023-04-12 ENCOUNTER — Ambulatory Visit (HOSPITAL_COMMUNITY)
Admission: RE | Admit: 2023-04-12 | Discharge: 2023-04-12 | Disposition: A | Discharge status: Home / Self Care | Payer: Medicare Other | Source: Ambulatory Visit | Attending: Family Medicine | Admitting: Family Medicine

## 2023-04-12 DIAGNOSIS — Z1231 Encounter for screening mammogram for malignant neoplasm of breast: Secondary | ICD-10-CM | POA: Insufficient documentation

## 2023-04-14 ENCOUNTER — Encounter (HOSPITAL_COMMUNITY): Payer: Self-pay | Admitting: Family Medicine

## 2023-04-21 ENCOUNTER — Other Ambulatory Visit (HOSPITAL_COMMUNITY): Payer: Self-pay | Admitting: Family Medicine

## 2023-04-21 DIAGNOSIS — R928 Other abnormal and inconclusive findings on diagnostic imaging of breast: Secondary | ICD-10-CM

## 2023-05-15 DIAGNOSIS — I10 Essential (primary) hypertension: Secondary | ICD-10-CM | POA: Diagnosis not present

## 2023-05-15 DIAGNOSIS — R059 Cough, unspecified: Secondary | ICD-10-CM | POA: Diagnosis not present

## 2023-05-15 DIAGNOSIS — J40 Bronchitis, not specified as acute or chronic: Secondary | ICD-10-CM | POA: Diagnosis not present

## 2023-05-15 DIAGNOSIS — E119 Type 2 diabetes mellitus without complications: Secondary | ICD-10-CM | POA: Diagnosis not present

## 2023-06-01 ENCOUNTER — Other Ambulatory Visit (HOSPITAL_COMMUNITY): Payer: Self-pay | Admitting: Family Medicine

## 2023-06-01 ENCOUNTER — Ambulatory Visit (HOSPITAL_COMMUNITY)
Admission: RE | Admit: 2023-06-01 | Discharge: 2023-06-01 | Disposition: A | Discharge status: Home / Self Care | Payer: Medicare Other | Source: Ambulatory Visit | Attending: Family Medicine | Admitting: Family Medicine

## 2023-06-01 ENCOUNTER — Encounter (HOSPITAL_COMMUNITY): Payer: Self-pay

## 2023-06-01 DIAGNOSIS — R92333 Mammographic heterogeneous density, bilateral breasts: Secondary | ICD-10-CM | POA: Diagnosis not present

## 2023-06-01 DIAGNOSIS — R928 Other abnormal and inconclusive findings on diagnostic imaging of breast: Secondary | ICD-10-CM | POA: Insufficient documentation

## 2023-06-01 DIAGNOSIS — N6315 Unspecified lump in the right breast, overlapping quadrants: Secondary | ICD-10-CM | POA: Diagnosis not present

## 2023-07-12 DIAGNOSIS — D631 Anemia in chronic kidney disease: Secondary | ICD-10-CM | POA: Diagnosis not present

## 2023-07-12 DIAGNOSIS — E213 Hyperparathyroidism, unspecified: Secondary | ICD-10-CM | POA: Diagnosis not present

## 2023-07-12 DIAGNOSIS — N1831 Chronic kidney disease, stage 3a: Secondary | ICD-10-CM | POA: Diagnosis not present

## 2023-07-12 DIAGNOSIS — E1121 Type 2 diabetes mellitus with diabetic nephropathy: Secondary | ICD-10-CM | POA: Diagnosis not present

## 2023-07-12 DIAGNOSIS — I129 Hypertensive chronic kidney disease with stage 1 through stage 4 chronic kidney disease, or unspecified chronic kidney disease: Secondary | ICD-10-CM | POA: Diagnosis not present

## 2023-07-19 DIAGNOSIS — E559 Vitamin D deficiency, unspecified: Secondary | ICD-10-CM | POA: Diagnosis not present

## 2023-07-19 DIAGNOSIS — R202 Paresthesia of skin: Secondary | ICD-10-CM | POA: Diagnosis not present

## 2023-07-19 DIAGNOSIS — E1122 Type 2 diabetes mellitus with diabetic chronic kidney disease: Secondary | ICD-10-CM | POA: Diagnosis not present

## 2023-07-19 DIAGNOSIS — I129 Hypertensive chronic kidney disease with stage 1 through stage 4 chronic kidney disease, or unspecified chronic kidney disease: Secondary | ICD-10-CM | POA: Diagnosis not present

## 2023-07-19 DIAGNOSIS — N1831 Chronic kidney disease, stage 3a: Secondary | ICD-10-CM | POA: Diagnosis not present

## 2023-07-19 DIAGNOSIS — I493 Ventricular premature depolarization: Secondary | ICD-10-CM | POA: Diagnosis not present

## 2023-07-19 DIAGNOSIS — R928 Other abnormal and inconclusive findings on diagnostic imaging of breast: Secondary | ICD-10-CM | POA: Diagnosis not present

## 2023-07-19 DIAGNOSIS — D631 Anemia in chronic kidney disease: Secondary | ICD-10-CM | POA: Diagnosis not present

## 2023-07-19 DIAGNOSIS — R946 Abnormal results of thyroid function studies: Secondary | ICD-10-CM | POA: Diagnosis not present

## 2023-07-19 DIAGNOSIS — E213 Hyperparathyroidism, unspecified: Secondary | ICD-10-CM | POA: Diagnosis not present

## 2023-07-19 DIAGNOSIS — K219 Gastro-esophageal reflux disease without esophagitis: Secondary | ICD-10-CM | POA: Diagnosis not present

## 2023-08-20 DIAGNOSIS — D241 Benign neoplasm of right breast: Secondary | ICD-10-CM | POA: Diagnosis not present

## 2023-08-20 DIAGNOSIS — N62 Hypertrophy of breast: Secondary | ICD-10-CM | POA: Diagnosis not present

## 2023-08-20 DIAGNOSIS — N6315 Unspecified lump in the right breast, overlapping quadrants: Secondary | ICD-10-CM | POA: Diagnosis not present

## 2023-08-20 DIAGNOSIS — N6310 Unspecified lump in the right breast, unspecified quadrant: Secondary | ICD-10-CM | POA: Diagnosis not present

## 2023-08-20 DIAGNOSIS — R92331 Mammographic heterogeneous density, right breast: Secondary | ICD-10-CM | POA: Diagnosis not present

## 2023-08-20 DIAGNOSIS — R928 Other abnormal and inconclusive findings on diagnostic imaging of breast: Secondary | ICD-10-CM | POA: Diagnosis not present

## 2023-08-20 DIAGNOSIS — N6091 Unspecified benign mammary dysplasia of right breast: Secondary | ICD-10-CM | POA: Diagnosis not present

## 2023-08-20 DIAGNOSIS — N61 Mastitis without abscess: Secondary | ICD-10-CM | POA: Diagnosis not present

## 2023-08-20 DIAGNOSIS — N6489 Other specified disorders of breast: Secondary | ICD-10-CM | POA: Diagnosis not present

## 2023-09-03 DIAGNOSIS — D241 Benign neoplasm of right breast: Secondary | ICD-10-CM | POA: Diagnosis not present

## 2024-01-14 DIAGNOSIS — E1122 Type 2 diabetes mellitus with diabetic chronic kidney disease: Secondary | ICD-10-CM | POA: Diagnosis not present

## 2024-01-14 DIAGNOSIS — I129 Hypertensive chronic kidney disease with stage 1 through stage 4 chronic kidney disease, or unspecified chronic kidney disease: Secondary | ICD-10-CM | POA: Diagnosis not present

## 2024-01-14 DIAGNOSIS — I493 Ventricular premature depolarization: Secondary | ICD-10-CM | POA: Diagnosis not present

## 2024-01-14 DIAGNOSIS — D631 Anemia in chronic kidney disease: Secondary | ICD-10-CM | POA: Diagnosis not present

## 2024-01-14 DIAGNOSIS — N1831 Chronic kidney disease, stage 3a: Secondary | ICD-10-CM | POA: Diagnosis not present

## 2024-01-14 DIAGNOSIS — E559 Vitamin D deficiency, unspecified: Secondary | ICD-10-CM | POA: Diagnosis not present

## 2024-01-21 DIAGNOSIS — G4700 Insomnia, unspecified: Secondary | ICD-10-CM | POA: Diagnosis not present

## 2024-01-21 DIAGNOSIS — E785 Hyperlipidemia, unspecified: Secondary | ICD-10-CM | POA: Diagnosis not present

## 2024-01-21 DIAGNOSIS — E11610 Type 2 diabetes mellitus with diabetic neuropathic arthropathy: Secondary | ICD-10-CM | POA: Diagnosis not present

## 2024-01-21 DIAGNOSIS — K219 Gastro-esophageal reflux disease without esophagitis: Secondary | ICD-10-CM | POA: Diagnosis not present

## 2024-01-21 DIAGNOSIS — I493 Ventricular premature depolarization: Secondary | ICD-10-CM | POA: Diagnosis not present

## 2024-01-21 DIAGNOSIS — Z23 Encounter for immunization: Secondary | ICD-10-CM | POA: Diagnosis not present

## 2024-01-21 DIAGNOSIS — E1122 Type 2 diabetes mellitus with diabetic chronic kidney disease: Secondary | ICD-10-CM | POA: Diagnosis not present

## 2024-01-21 DIAGNOSIS — Z0000 Encounter for general adult medical examination without abnormal findings: Secondary | ICD-10-CM | POA: Diagnosis not present

## 2024-01-21 DIAGNOSIS — I129 Hypertensive chronic kidney disease with stage 1 through stage 4 chronic kidney disease, or unspecified chronic kidney disease: Secondary | ICD-10-CM | POA: Diagnosis not present

## 2024-01-21 DIAGNOSIS — N1831 Chronic kidney disease, stage 3a: Secondary | ICD-10-CM | POA: Diagnosis not present
